# Patient Record
Sex: Male | Born: 1935 | Race: White | Hispanic: No | Marital: Single | Smoking: Never smoker
Health system: Southern US, Community
[De-identification: ages and names within clinical notes are randomized; demographics above are authoritative.]

## PROBLEM LIST (undated history)

## (undated) DIAGNOSIS — E785 Hyperlipidemia, unspecified: Secondary | ICD-10-CM

## (undated) DIAGNOSIS — K210 Gastro-esophageal reflux disease with esophagitis: Secondary | ICD-10-CM

## (undated) DIAGNOSIS — R5381 Other malaise: Secondary | ICD-10-CM

## (undated) DIAGNOSIS — N182 Chronic kidney disease, stage 2 (mild): Secondary | ICD-10-CM

## (undated) DIAGNOSIS — N401 Enlarged prostate with lower urinary tract symptoms: Secondary | ICD-10-CM

## (undated) DIAGNOSIS — R269 Unspecified abnormalities of gait and mobility: Secondary | ICD-10-CM

## (undated) DIAGNOSIS — H259 Unspecified age-related cataract: Secondary | ICD-10-CM

## (undated) DIAGNOSIS — I10 Essential (primary) hypertension: Secondary | ICD-10-CM

## (undated) DIAGNOSIS — R7989 Other specified abnormal findings of blood chemistry: Secondary | ICD-10-CM

## (undated) DIAGNOSIS — I509 Heart failure, unspecified: Secondary | ICD-10-CM

## (undated) DIAGNOSIS — R0602 Shortness of breath: Secondary | ICD-10-CM

## (undated) DIAGNOSIS — E039 Hypothyroidism, unspecified: Secondary | ICD-10-CM

## (undated) DIAGNOSIS — G250 Essential tremor: Secondary | ICD-10-CM

## (undated) DIAGNOSIS — R32 Unspecified urinary incontinence: Secondary | ICD-10-CM

## (undated) DIAGNOSIS — IMO0002 Reserved for concepts with insufficient information to code with codable children: Secondary | ICD-10-CM

## (undated) DIAGNOSIS — I2510 Atherosclerotic heart disease of native coronary artery without angina pectoris: Secondary | ICD-10-CM

## (undated) DIAGNOSIS — H9190 Unspecified hearing loss, unspecified ear: Secondary | ICD-10-CM

## (undated) DIAGNOSIS — K449 Diaphragmatic hernia without obstruction or gangrene: Secondary | ICD-10-CM

## (undated) DIAGNOSIS — Z7901 Long term (current) use of anticoagulants: Secondary | ICD-10-CM

## (undated) DIAGNOSIS — R351 Nocturia: Secondary | ICD-10-CM

## (undated) DIAGNOSIS — M2669 Other specified disorders of temporomandibular joint: Secondary | ICD-10-CM

## (undated) DIAGNOSIS — G252 Other specified forms of tremor: Secondary | ICD-10-CM

## (undated) HISTORY — DX: Shortness of breath: R06.02

## (undated) HISTORY — DX: Atherosclerotic heart disease of native coronary artery without angina pectoris: I25.10

## (undated) HISTORY — DX: Unspecified hearing loss, unspecified ear: H91.90

## (undated) HISTORY — DX: Unspecified abnormalities of gait and mobility: R26.9

## (undated) HISTORY — DX: Unspecified age-related cataract: H25.9

## (undated) HISTORY — DX: Other malaise: R53.81

## (undated) HISTORY — DX: Unspecified urinary incontinence: R32

## (undated) HISTORY — PX: CARDIAC SURGERY: SHX584

## (undated) HISTORY — DX: Long term (current) use of anticoagulants: Z79.01

## (undated) HISTORY — DX: Hypothyroidism, unspecified: E03.9

## (undated) HISTORY — DX: Other specified disorders of temporomandibular joint: M26.69

## (undated) HISTORY — DX: Hyperlipidemia, unspecified: E78.5

## (undated) HISTORY — DX: Diaphragmatic hernia without obstruction or gangrene: K44.9

## (undated) HISTORY — DX: Benign prostatic hyperplasia with lower urinary tract symptoms: N40.1

## (undated) HISTORY — DX: Essential tremor: G25.0

## (undated) HISTORY — DX: Other specified forms of tremor: G25.2

## (undated) HISTORY — DX: Chronic kidney disease, stage 2 (mild): N18.2

## (undated) HISTORY — DX: Other specified abnormal findings of blood chemistry: R79.89

## (undated) HISTORY — DX: Gastro-esophageal reflux disease with esophagitis: K21.0

## (undated) HISTORY — PX: APPENDECTOMY: SHX54

## (undated) HISTORY — DX: Nocturia: R35.1

---
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
## 1988-11-05 HISTORY — PX: INGUINAL HERNIA REPAIR: SUR1180

## 1998-06-11 ENCOUNTER — Emergency Department (HOSPITAL_COMMUNITY): Admission: EM | Admit: 1998-06-11 | Discharge: 1998-06-11 | Payer: Self-pay | Admitting: Emergency Medicine

## 1998-12-28 ENCOUNTER — Ambulatory Visit (HOSPITAL_BASED_OUTPATIENT_CLINIC_OR_DEPARTMENT_OTHER): Admission: RE | Admit: 1998-12-28 | Discharge: 1998-12-28 | Payer: Self-pay | Admitting: Surgery

## 1999-08-15 ENCOUNTER — Encounter: Payer: Self-pay | Admitting: Anesthesiology

## 1999-08-18 ENCOUNTER — Encounter (INDEPENDENT_AMBULATORY_CARE_PROVIDER_SITE_OTHER): Payer: Self-pay | Admitting: Specialist

## 1999-08-18 ENCOUNTER — Inpatient Hospital Stay (HOSPITAL_COMMUNITY): Admission: RE | Admit: 1999-08-18 | Discharge: 1999-08-20 | Payer: Self-pay | Admitting: Urology

## 2003-01-07 DIAGNOSIS — K449 Diaphragmatic hernia without obstruction or gangrene: Secondary | ICD-10-CM

## 2003-01-07 DIAGNOSIS — K2100 Gastro-esophageal reflux disease with esophagitis, without bleeding: Secondary | ICD-10-CM

## 2003-01-07 DIAGNOSIS — I2510 Atherosclerotic heart disease of native coronary artery without angina pectoris: Secondary | ICD-10-CM

## 2003-01-07 HISTORY — DX: Diaphragmatic hernia without obstruction or gangrene: K44.9

## 2003-01-07 HISTORY — DX: Gastro-esophageal reflux disease with esophagitis, without bleeding: K21.00

## 2003-01-07 HISTORY — DX: Atherosclerotic heart disease of native coronary artery without angina pectoris: I25.10

## 2003-07-27 ENCOUNTER — Encounter: Payer: Self-pay | Admitting: Emergency Medicine

## 2003-07-27 ENCOUNTER — Inpatient Hospital Stay (HOSPITAL_COMMUNITY): Admission: EM | Admit: 2003-07-27 | Discharge: 2003-07-29 | Payer: Self-pay | Admitting: *Deleted

## 2003-07-27 ENCOUNTER — Emergency Department (HOSPITAL_COMMUNITY): Admission: EM | Admit: 2003-07-27 | Discharge: 2003-07-27 | Payer: Self-pay | Admitting: Emergency Medicine

## 2003-09-29 ENCOUNTER — Encounter: Admission: RE | Admit: 2003-09-29 | Discharge: 2003-09-29 | Payer: Self-pay | Admitting: Gastroenterology

## 2009-12-27 DIAGNOSIS — E785 Hyperlipidemia, unspecified: Secondary | ICD-10-CM

## 2009-12-27 DIAGNOSIS — G252 Other specified forms of tremor: Secondary | ICD-10-CM | POA: Insufficient documentation

## 2009-12-27 DIAGNOSIS — G250 Essential tremor: Secondary | ICD-10-CM

## 2009-12-27 DIAGNOSIS — H259 Unspecified age-related cataract: Secondary | ICD-10-CM

## 2009-12-27 DIAGNOSIS — I10 Essential (primary) hypertension: Secondary | ICD-10-CM

## 2009-12-27 HISTORY — DX: Essential tremor: G25.0

## 2009-12-27 HISTORY — DX: Hyperlipidemia, unspecified: E78.5

## 2009-12-27 HISTORY — DX: Unspecified age-related cataract: H25.9

## 2009-12-27 HISTORY — DX: Essential (primary) hypertension: I10

## 2009-12-29 ENCOUNTER — Inpatient Hospital Stay (HOSPITAL_COMMUNITY): Admission: EM | Admit: 2009-12-29 | Discharge: 2010-01-04 | Payer: Self-pay | Admitting: Emergency Medicine

## 2009-12-30 ENCOUNTER — Encounter (HOSPITAL_BASED_OUTPATIENT_CLINIC_OR_DEPARTMENT_OTHER): Payer: Self-pay | Admitting: Internal Medicine

## 2010-01-04 DIAGNOSIS — R5381 Other malaise: Secondary | ICD-10-CM

## 2010-01-04 DIAGNOSIS — I509 Heart failure, unspecified: Secondary | ICD-10-CM

## 2010-01-04 DIAGNOSIS — Z7901 Long term (current) use of anticoagulants: Secondary | ICD-10-CM

## 2010-01-04 HISTORY — DX: Heart failure, unspecified: I50.9

## 2010-01-04 HISTORY — DX: Long term (current) use of anticoagulants: Z79.01

## 2010-01-04 HISTORY — DX: Other malaise: R53.81

## 2010-01-06 DIAGNOSIS — H9190 Unspecified hearing loss, unspecified ear: Secondary | ICD-10-CM

## 2010-01-06 HISTORY — DX: Unspecified hearing loss, unspecified ear: H91.90

## 2010-02-20 DIAGNOSIS — R0602 Shortness of breath: Secondary | ICD-10-CM

## 2010-02-20 HISTORY — DX: Shortness of breath: R06.02

## 2010-06-05 DIAGNOSIS — N182 Chronic kidney disease, stage 2 (mild): Secondary | ICD-10-CM

## 2010-06-05 DIAGNOSIS — E039 Hypothyroidism, unspecified: Secondary | ICD-10-CM

## 2010-06-05 HISTORY — DX: Chronic kidney disease, stage 2 (mild): N18.2

## 2010-06-05 HISTORY — DX: Hypothyroidism, unspecified: E03.9

## 2010-12-11 DIAGNOSIS — R32 Unspecified urinary incontinence: Secondary | ICD-10-CM

## 2010-12-11 DIAGNOSIS — R351 Nocturia: Secondary | ICD-10-CM

## 2010-12-11 DIAGNOSIS — R269 Unspecified abnormalities of gait and mobility: Secondary | ICD-10-CM

## 2010-12-11 HISTORY — DX: Unspecified abnormalities of gait and mobility: R26.9

## 2010-12-11 HISTORY — DX: Nocturia: R35.1

## 2010-12-11 HISTORY — DX: Unspecified urinary incontinence: R32

## 2011-01-24 LAB — URINALYSIS, ROUTINE W REFLEX MICROSCOPIC
Bilirubin Urine: NEGATIVE
Glucose, UA: NEGATIVE mg/dL
Ketones, ur: NEGATIVE mg/dL
Leukocytes, UA: NEGATIVE
Nitrite: NEGATIVE
Protein, ur: 100 mg/dL — AB
Specific Gravity, Urine: 1.021 (ref 1.005–1.030)
Urobilinogen, UA: 1 mg/dL (ref 0.0–1.0)
pH: 5 (ref 5.0–8.0)

## 2011-01-24 LAB — CBC
HCT: 30 % — ABNORMAL LOW (ref 39.0–52.0)
HCT: 32.2 % — ABNORMAL LOW (ref 39.0–52.0)
HCT: 33.5 % — ABNORMAL LOW (ref 39.0–52.0)
Hemoglobin: 10.3 g/dL — ABNORMAL LOW (ref 13.0–17.0)
Hemoglobin: 11 g/dL — ABNORMAL LOW (ref 13.0–17.0)
Hemoglobin: 11.4 g/dL — ABNORMAL LOW (ref 13.0–17.0)
Hemoglobin: 12.1 g/dL — ABNORMAL LOW (ref 13.0–17.0)
MCHC: 34.3 g/dL (ref 30.0–36.0)
MCV: 91.2 fL (ref 78.0–100.0)
MCV: 91.6 fL (ref 78.0–100.0)
Platelets: 163 10*3/uL (ref 150–400)
Platelets: 191 10*3/uL (ref 150–400)
Platelets: 213 10*3/uL (ref 150–400)
RBC: 3.27 MIL/uL — ABNORMAL LOW (ref 4.22–5.81)
RBC: 3.7 MIL/uL — ABNORMAL LOW (ref 4.22–5.81)
RBC: 3.84 MIL/uL — ABNORMAL LOW (ref 4.22–5.81)
RDW: 13.6 % (ref 11.5–15.5)
WBC: 6.9 10*3/uL (ref 4.0–10.5)
WBC: 8.3 10*3/uL (ref 4.0–10.5)
WBC: 9.7 10*3/uL (ref 4.0–10.5)

## 2011-01-24 LAB — HEPARIN LEVEL (UNFRACTIONATED)
Heparin Unfractionated: 0.29 IU/mL — ABNORMAL LOW (ref 0.30–0.70)
Heparin Unfractionated: 0.47 IU/mL (ref 0.30–0.70)

## 2011-01-24 LAB — COMPREHENSIVE METABOLIC PANEL
ALT: 27 U/L (ref 0–53)
AST: 25 U/L (ref 0–37)
Albumin: 2.4 g/dL — ABNORMAL LOW (ref 3.5–5.2)
Alkaline Phosphatase: 50 U/L (ref 39–117)
Alkaline Phosphatase: 51 U/L (ref 39–117)
BUN: 51 mg/dL — ABNORMAL HIGH (ref 6–23)
CO2: 26 mEq/L (ref 19–32)
CO2: 30 mEq/L (ref 19–32)
Calcium: 8 mg/dL — ABNORMAL LOW (ref 8.4–10.5)
Chloride: 103 mEq/L (ref 96–112)
Creatinine, Ser: 1.93 mg/dL — ABNORMAL HIGH (ref 0.4–1.5)
GFR calc Af Amer: 46 mL/min — ABNORMAL LOW (ref >60)
GFR calc non Af Amer: 32 mL/min — ABNORMAL LOW (ref >60)
GFR calc non Af Amer: 38 mL/min — ABNORMAL LOW (ref >60)
Glucose, Bld: 114 mg/dL — ABNORMAL HIGH (ref 70–99)
Glucose, Bld: 117 mg/dL — ABNORMAL HIGH (ref 70–99)
Potassium: 3.9 mEq/L (ref 3.5–5.1)
Sodium: 141 mEq/L (ref 135–145)
Total Bilirubin: 0.6 mg/dL (ref 0.3–1.2)

## 2011-01-24 LAB — DIFFERENTIAL
Basophils Absolute: 0 10*3/uL (ref 0.0–0.1)
Basophils Relative: 0 % (ref 0–1)
Basophils Relative: 0 % (ref 0–1)
Basophils Relative: 0 % (ref 0–1)
Eosinophils Absolute: 0.4 10*3/uL (ref 0.0–0.7)
Eosinophils Relative: 0 % (ref 0–5)
Eosinophils Relative: 4 % (ref 0–5)
Lymphocytes Relative: 12 % (ref 12–46)
Lymphs Abs: 0.3 10*3/uL — ABNORMAL LOW (ref 0.7–4.0)
Lymphs Abs: 1.1 10*3/uL (ref 0.7–4.0)
Monocytes Relative: 6 % (ref 3–12)
Monocytes Relative: 9 % (ref 3–12)
Neutro Abs: 6.2 10*3/uL (ref 1.7–7.7)
Neutro Abs: 6.2 10*3/uL (ref 1.7–7.7)
Neutrophils Relative %: 75 % (ref 43–77)
Neutrophils Relative %: 75 % (ref 43–77)
WBC Morphology: INCREASED

## 2011-01-24 LAB — CARDIAC PANEL(CRET KIN+CKTOT+MB+TROPI)
Relative Index: 3.4 — ABNORMAL HIGH (ref 0.0–2.5)
Total CK: 248 U/L — ABNORMAL HIGH (ref 7–232)
Total CK: 278 U/L — ABNORMAL HIGH (ref 7–232)
Troponin I: 0.26 ng/mL — ABNORMAL HIGH (ref 0.00–0.06)
Troponin I: 0.29 ng/mL — ABNORMAL HIGH (ref 0.00–0.06)

## 2011-01-24 LAB — BASIC METABOLIC PANEL
CO2: 26 mEq/L (ref 19–32)
Calcium: 8 mg/dL — ABNORMAL LOW (ref 8.4–10.5)
Creatinine, Ser: 1.9 mg/dL — ABNORMAL HIGH (ref 0.4–1.5)
GFR calc Af Amer: 40 mL/min — ABNORMAL LOW (ref >60)
GFR calc non Af Amer: 31 mL/min — ABNORMAL LOW (ref >60)
GFR calc non Af Amer: 33 mL/min — ABNORMAL LOW (ref >60)
Glucose, Bld: 144 mg/dL — ABNORMAL HIGH (ref 70–99)
Potassium: 4.1 mEq/L (ref 3.5–5.1)
Sodium: 137 mEq/L (ref 135–145)

## 2011-01-24 LAB — CULTURE, BLOOD (ROUTINE X 2)

## 2011-01-24 LAB — POCT I-STAT, CHEM 8
BUN: 33 mg/dL — ABNORMAL HIGH (ref 6–23)
Calcium, Ion: 1.01 mmol/L — ABNORMAL LOW (ref 1.12–1.32)
Chloride: 104 mEq/L (ref 96–112)
Creatinine, Ser: 1.9 mg/dL — ABNORMAL HIGH (ref 0.4–1.5)

## 2011-01-24 LAB — PROTIME-INR: Prothrombin Time: 13.5 seconds (ref 11.6–15.2)

## 2011-01-24 LAB — CK TOTAL AND CKMB (NOT AT ARMC)
Relative Index: 2.5 (ref 0.0–2.5)
Relative Index: 3 — ABNORMAL HIGH (ref 0.0–2.5)
Total CK: 259 U/L — ABNORMAL HIGH (ref 7–232)

## 2011-01-24 LAB — TROPONIN I: Troponin I: 0.6 ng/mL (ref 0.00–0.06)

## 2011-01-24 LAB — URINE MICROSCOPIC-ADD ON

## 2011-01-24 LAB — BRAIN NATRIURETIC PEPTIDE: Pro B Natriuretic peptide (BNP): 421 pg/mL — ABNORMAL HIGH (ref 0.0–100.0)

## 2011-01-28 LAB — COMPREHENSIVE METABOLIC PANEL
ALT: 26 U/L (ref 0–53)
AST: 29 U/L (ref 0–37)
Albumin: 2.5 g/dL — ABNORMAL LOW (ref 3.5–5.2)
BUN: 25 mg/dL — ABNORMAL HIGH (ref 6–23)
CO2: 30 mEq/L (ref 19–32)
Calcium: 8.1 mg/dL — ABNORMAL LOW (ref 8.4–10.5)
Chloride: 105 mEq/L (ref 96–112)
Creatinine, Ser: 1.61 mg/dL — ABNORMAL HIGH (ref 0.4–1.5)
Creatinine, Ser: 1.76 mg/dL — ABNORMAL HIGH (ref 0.4–1.5)
GFR calc Af Amer: 43 mL/min — ABNORMAL LOW (ref >60)
GFR calc non Af Amer: 40 mL/min — ABNORMAL LOW (ref >60)
Glucose, Bld: 117 mg/dL — ABNORMAL HIGH (ref 70–99)
Sodium: 139 mEq/L (ref 135–145)
Sodium: 141 mEq/L (ref 135–145)
Total Bilirubin: 0.7 mg/dL (ref 0.3–1.2)
Total Protein: 5.7 g/dL — ABNORMAL LOW (ref 6.0–8.3)

## 2011-01-28 LAB — CBC
Hemoglobin: 11.7 g/dL — ABNORMAL LOW (ref 13.0–17.0)
MCHC: 33.8 g/dL (ref 30.0–36.0)
MCV: 91.1 fL (ref 78.0–100.0)
MCV: 91.4 fL (ref 78.0–100.0)
Platelets: 313 10*3/uL (ref 150–400)
RBC: 3.8 MIL/uL — ABNORMAL LOW (ref 4.22–5.81)
RBC: 3.92 MIL/uL — ABNORMAL LOW (ref 4.22–5.81)
RDW: 14.2 % (ref 11.5–15.5)
WBC: 12.6 10*3/uL — ABNORMAL HIGH (ref 4.0–10.5)

## 2011-01-28 LAB — DIFFERENTIAL
Basophils Absolute: 0 10*3/uL (ref 0.0–0.1)
Eosinophils Absolute: 0.3 10*3/uL (ref 0.0–0.7)
Eosinophils Absolute: 0.4 10*3/uL (ref 0.0–0.7)
Eosinophils Relative: 3 % (ref 0–5)
Lymphocytes Relative: 11 % — ABNORMAL LOW (ref 12–46)
Lymphocytes Relative: 12 % (ref 12–46)
Lymphs Abs: 1.2 10*3/uL (ref 0.7–4.0)
Lymphs Abs: 1.4 10*3/uL (ref 0.7–4.0)
Monocytes Absolute: 1.1 10*3/uL — ABNORMAL HIGH (ref 0.1–1.0)
Monocytes Relative: 9 % (ref 3–12)
Neutro Abs: 8.3 10*3/uL — ABNORMAL HIGH (ref 1.7–7.7)
Neutrophils Relative %: 77 % (ref 43–77)

## 2011-01-28 LAB — BRAIN NATRIURETIC PEPTIDE: Pro B Natriuretic peptide (BNP): 167 pg/mL — ABNORMAL HIGH (ref 0.0–100.0)

## 2011-01-28 LAB — PROTIME-INR
INR: 1.51 — ABNORMAL HIGH (ref 0.00–1.49)
Prothrombin Time: 18.1 seconds — ABNORMAL HIGH (ref 11.6–15.2)

## 2011-01-28 LAB — HEPARIN LEVEL (UNFRACTIONATED): Heparin Unfractionated: 0.57 IU/mL (ref 0.30–0.70)

## 2011-03-19 DIAGNOSIS — N138 Other obstructive and reflux uropathy: Secondary | ICD-10-CM

## 2011-03-19 HISTORY — DX: Other obstructive and reflux uropathy: N13.8

## 2011-03-23 NOTE — Cardiovascular Report (Signed)
NAME:  Larry Underwood, Larry Underwood NO.:  [id number]   MEDICAL RECORD NO.:  [id number]                   PATIENT TYPE:  INP   LOCATION:  6529                                 FACILITY:  [facility]   PHYSICIAN:  Meade Maw, M.D.                 DATE OF BIRTH:  1909-02-23   DATE OF PROCEDURE:  DATE OF DISCHARGE:                              CARDIAC CATHETERIZATION   REFERRING PHYSICIAN:  Vania Rea. Jarold Underwood, M.D. [facility].   INDICATIONS FOR PROCEDURE:  Chest pain with positive cardiac enzymes.   PROCEDURE:  After obtaining written informed consent the patient was brought  to the cardiac catheterization laboratory in the postabsorptive state.  Preoperative sedation was achieved using Versed 1 mg IV.  The right groin  was prepped and draped in the usual sterile fashion.  Local anesthesia was  achieved using 1% Xylocaine.  A 6-French hemostasis sheath was placed into  the right femoral artery using the modified Seldinger technique.  Selective  coronary angiography was performed using a JL4, JR4 Judkins catheter.  Single plane ventriculogram was performed in the RAO positions.  Multiple  views were obtained.  All catheter exchanges were made over a guidewire.  The hemostasis sheath was placed following each engagement.  The patient was  noted to be somewhat bradycardic throughout the procedure with heart rates  in the 50-60s.  Following Versed 1 mg IV he had systolic pressure decreased  from 106 down into the 80s.  He was given IV fluids with improvement in his  pressure.   FINDINGS:  The aortic pressure was 106/60.  LV pressure is 106/8.  EDP was  11.  Single plane ventriculogram revealed inferior basilar hypokinesis,  ejection fraction of 60%.  Fluoroscopy revealed significant calcification  around the mitral valve anulus.   CORONARY ANGIOGRAPHY:  The left main coronary artery bifurcates into the  left anterior descending and circumflex vessel.  There is no significant  disease in the left main coronary artery.   Left anterior descending gives rise to a moderate sized D1, small D2, small  D3, goes on to end as an apical branch.  There is luminal irregularities in  the left anterior descending vessel.  The first diagonal has a 60-70% ostial  lesion.   Circumflex vessel is a moderate sized vessel.  Gives rise to a trivial OM 1,  small to moderate OM 2, small OM 3.  There is sequential 60-70% lesion in  the proximal obtuse marginal.   Right coronary artery is a large, dominant artery.  There is a 90% lesion  noted in the proximal right coronary artery.   FINAL IMPRESSION:  1. Critical disease in the proximal right coronary artery.  2. Preserved left ventricular function.  3. Calcification in the mitral anulus.    RECOMMENDATIONS:  The films were reviewed with Larry Underwood, M.D.  He  agrees to proceed with intervention on the proximal  right coronary artery.  The patient will have a transthoracic echocardiogram performed in the  outpatient setting for further evaluation of mitral regurgitation.                                               Meade Maw, M.D.    HP/MEDQ  D:  07/28/2003  T:  07/29/2003  Job:  [id number]

## 2011-03-23 NOTE — H&P (Signed)
NAME:  [name], [name] NO.:  [id number]   MEDICAL RECORD NO.:  [id number]                   PATIENT TYPE:  INP   LOCATION:  3730                                 FACILITY:  [facility]   PHYSICIAN:  Meade Maw, M.D.                 DATE OF BIRTH:  [birth year]   DATE OF ADMISSION:  07/27/2003  DATE OF DISCHARGE:                                HISTORY & PHYSICAL   HISTORY OF PRESENT ILLNESS:  Larry Underwood is a 77 year old white man who  is admitted to [facility] for further evaluation of chest pain.   The patient, who has no past history of cardiac disease, presented to the  emergency department at [facility] after experiencing an episode  of chest pain.  This began while he was driving home from work.  The chest  pain was described as a pressure in the left anterior chest.  It did not  radiate.  It was associated with mild dyspnea but no diaphoresis or nausea.  There were no exacerbating or ameliorating factors.  It appeared not to be  related to position, activity, meals, or respirations.  It continued until  he arrived home.  Within approximately 30 minutes, it had resolved.  However, he then proceeded to the emergency department at [facility] and admission was recommended for further evaluation.  The patient  has not experienced any chest pain since the initial episode.   As noted, the patient has no past history of cardiac disease, including no  history of chest pain, myocardial infarction, coronary artery disease, or  congestive heart failure.  He has no risk factors for coronary artery  disease, including no history of hypertension, hyperlipidemia, diabetes  mellitus, smoking, or family history of early coronary artery disease.   The patient has no other medical problems.   MEDICATIONS:  He takes no medications.   ALLERGIES:  He is not allergic to any medications.   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  He lives with  his second wife.  He has children from his first marriage.   PREVIOUS OPERATIONS:  1. The patient has undergone herniorrhaphies, in [place] and 1996.  2. He has also undergone bilateral cataract operations.   SIGNIFICANT INJURIES:  None.   FAMILY HISTORY:  Family history is unremarkable.  Specifically, there is no  history of hypertension, coronary artery disease, stroke, diabetes, or  cancer.  His mother lived until she was 68.   REVIEW OF SYSTEMS:  Review of systems reveals no problems related to his  head, eyes, ears, nose, mouth, throat, lungs, gastrointestinal system,  genitourinary system, or extremities.  There is no history of a neurologic  or psychiatric disorder.  There is no history of fever, chills, or weight  loss.   PHYSICAL EXAMINATION:  VITAL SIGNS:  Blood pressure 156/88.  Pulse 82 and  regular.  Respirations 20.  Temperature 97.8.  GENERAL:  The patient was an elderly white male in no discomfort.  He was  somewhat hard of hearing.  He appeared younger than his stated age.  He was  alert, oriented, and appropriate.  HEENT:  Head, eyes, nose, and mouth were normal.  NECK:  The neck was without thyromegaly or adenopathy.  Carotid pulses were  palpable bilaterally and without bruits.  CARDIAC:  Examination revealed a normal S1 and S2.  There was no S3, S4,  rub, or click.  There was a soft systolic ejection murmur heard at the left  sternal border.  Cardiac rhythm was regular.  No chest wall tenderness was  noted.  LUNGS:  The lungs were clear.  ABDOMEN:  The abdomen was soft and nontender.  There was no mass,  hepatosplenomegaly, bruit, distention, rebound, guarding or rigidity.  Bowel  sounds were normal.  RECTAL AND GENITAL:  Examinations were not performed as they were not  pertinent to the reason for acute care hospitalization.  EXTREMITIES:  The extremities were without edema, deviation, or deformity.  Radial and dorsalis pedal  pulses were palpable bilaterally.  NEUROLOGIC:  Brief screening neurologic survey was unremarkable.   LABORATORY AND ACCESSORY CLINICAL DATA:  The electrocardiogram revealed  marked sinus bradycardia with frequent PACs.  The possibility of an ectopic  atrial bradycardia could not be excluded.  There was diffuse T wave  flattening.   Potassium was 4.1, BUN 26, and creatinine 1.4.  White count was 6.2 with a  hemoglobin of 12.5 and hematocrit of 36.  Initial CK was 134 with a CK-MB of  8.9, relative index of 6.6, and troponin of 0.49.  The remaining studies  were pending at the time of this dictation.   IMPRESSION:  Chest pain, rule out myocardial infarction.  The history and  troponin suggest that the patient's chest pain is likely cardiac in origin.  He may either have unstable angina or a small myocardial infarction.   PLAN:  1. Telemetry.  2. Serial cardiac enzymes.  3. Aspirin.  4. Heparin.  5. Nitrol paste.  6. Fasting lipid profile.  7. No beta blocker at this time if his heart rate is in the 60s.  8. Further evaluation per Dr. Meade Maw.      Quita Skye. Waldon Reining, MD                   Meade Maw, M.D.   MSC/MEDQ  D:  07/28/2003  T:  07/28/2003  Job:  [id number]   cc:   Quita Skye. Waldon Reining, MD  [address]  [place], [place] [address]  Fax: [phone]

## 2011-03-23 NOTE — Discharge Summary (Signed)
NAME:  Larry Underwood, Larry Underwood NO.:  [id number]   MEDICAL RECORD NO.:  [id number]                   PATIENT TYPE:  INP   LOCATION:  6529                                 FACILITY:  [facility]   PHYSICIAN:  Meade Maw, M.D.                 DATE OF BIRTH:  11-09-1908   DATE OF ADMISSION:  07/27/2003  DATE OF DISCHARGE:  07/29/2003                                 DISCHARGE SUMMARY   DISCHARGING PHYSICIAN:  Dr. Fraser Din.   CHIEF COMPLAINT/REASON FOR ADMISSION:  This is a 77 year old male patient  who presented, to the [facility] Emergency Department, with complaints of  acute onset of chest pain, after coming from his office.  The chest pain did  not relieve with rest and it was in the middle of his chest, and quite  intense, and it caused shortness of breath.  Due to his symptoms and EKG  changes and age, it was felt that he was at high risk for MI and possible  need for cardiac cath; therefore, he was transferred over to [facility] at which point he was admitted to a telemetry unit for further  observation.   The patient has no significant past medical history.   ADMISSION DIAGNOSIS:  Chest pain rule out myocardial infarction.   HOSPITAL COURSE:  Problem #1.  Chest pain.  The patient again was admitted  to a telemetry unit where serial cardiac isoenzymes were done.  His peak  troponin was 1.10 with a normal creatinine.  He underwent cardiac  catheterization, on July 28, 2003, that revealed an RCA lesion of 99%  with thrombus.  He underwent PTCA of this area and is status post stent with  post PTCA stenosis being 0%.  He was placed on aspirin and Plavix post cath  and as well as Integrelin for 18 hours.  Other findings on the  catheterization were as follows:  the LAD had a moderate diagonal 1 and a  small diagonal 2 lesion.  There was a 70% ostium lesion at diagonal 1.  The  circumflex had trace obtuse marginal 1 lesion and an obtuse marginal  moderate located proximal 60-70% lesion.  His EF was 60% on the  catheterization.  The patient has done well in the post cath setting.  His  vital signs have remained stable and he has had no chest pain.  He did have  some documented hematemesis per nursing staff, although the patient  adamantly denied any hematemesis or hematochezia symptoms while hospitalized  or prior to arrival.  The patient will be discharged out on Plavix and  aspirin and is to followup with Dr. Fraser Din in her office in 2-3 weeks.   Problem #2.  Hematuria.  Within the first 12 hours after the patient  underwent cath and was on Integrelin, he developed hematuria and some  difficulty in controlling his urine as well.  The  difficulty controlling  urine was a problem before coming to the hospital per the patient's report.  His Integrelin was stopped by the nursing staff during the night, due to the  significant hematuria.  Dr. Etta Grandchild, of urology, did evaluate the patient  while here.  He has placed the patient on antibiotics and will complete an  additional GU or urology workup in the outpatient setting.   FINAL DISCHARGE DIAGNOSES:  1. 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  2. Trace coronary artery disease and other arteries as listed previously.  3. Hematuria to be followed outpatient by urology.  4. Questionable hematemesis.  The patient is scheduled for a     gastrointestinal workup as an outpatient.   DISCHARGE MEDICATIONS:  1. Protonix 40 mg p.o. daily.  2. Plavix 75 mg daily.  3. Aspirin 81 mg daily.  4. Sulfa/TNP double strength one b.i.d. for seven days.   DISCHARGE INSTRUCTIONS:   ACTIVITY:  No driving or sexual activity today, on the date of discharge.  Otherwise no lifting greater than 5 pounds, no bending, stooping or  straining for the next two days.   DIET:  Low-fat, low-cholesterol.   WOUND CARE:  Shower only x 2  days.   ADDITIONAL INSTRUCTIONS:  He is to call Dr. Lindell Spar office at [phone] if  he experiences any pain, swelling or bruising at the cath site.   FOLLOWUP APPOINTMENTS:  1. He has an appointment scheduled with Dr. Fraser Din on Tuesday, August 24, 2003 at 2:15 p.m.  2. And, he has an appointment with Dr. Georgian Co with [facility] on     August 19, 2003, at 3 p.m.  That telephone number is [phone].  And,     they have requested that our office fax pertinent records prior to this     patient's appointment.      Allison L. Joretta Bachelor, M.D.    ALE/MEDQ  D:  07/29/2003  T:  07/30/2003  Job:  [id number]

## 2011-03-23 NOTE — Discharge Summary (Signed)
   NAME:  Larry Underwood, Larry Underwood                        ACCOUNT NO.:  [id number]   MEDICAL RECORD NO.:  [id number]                   PATIENT TYPE:  INP   LOCATION:  6529                                 FACILITY:  [facility]   PHYSICIAN:  Meade Maw, M.D.                 DATE OF BIRTH:  [birth year]   DATE OF ADMISSION:  07/27/2003  DATE OF DISCHARGE:  07/29/2003                                 DISCHARGE SUMMARY   ADDENDUM:  This is an addendum to discharge summary [phone].   Additional recommendations from urology:  Patient at time of dictation was  awaiting a bladder scan, ordered by Dr. Etta Grandchild, there is the potential that  the patient will go home with an indwelling Foley catheter to a leg bag.      Allison L. Joretta Bachelor, M.D.    ALE/MEDQ  D:  07/29/2003  T:  07/31/2003  Job:  [id number]

## 2011-03-23 NOTE — Consult Note (Signed)
   NAME:  Larry Underwood, MOLCHAN                        ACCOUNT NO.:  [id number]   MEDICAL RECORD NO.:  [id number]                   PATIENT TYPE:  INP   LOCATION:  6529                                 FACILITY:  [facility]   PHYSICIAN:  Claudette Laws, M.D.               DATE OF BIRTH:  Mar 28, 1909   DATE OF CONSULTATION:  07/29/2003  DATE OF DISCHARGE:  07/29/2003                                   CONSULTATION   DISCUSSION:  I saw this gentleman earlier in the day with dribbling  urination, hematuria.  He was found to have about a 500 cc post void  residual by ultrasound.  I then came in late in the afternoon and put in a  20 [place] coude tip Foley catheter and we irrigated out a bladder full of  clots.  I spent about 30 minutes after which the irrigant was clear.  There  appeared to be no more clots.  However, the patient will have to be on  Plavix and aspirin because of his recent angioplasty.  The catheter was  hooked to a urinary leg bag and he will call me tonight if he has any  problems.  Otherwise the family will pick up his sulfa trimethoprim tonight,  start him on the antibiotics and then let me know how he gets along.                                                Claudette Laws, M.D.    RFS/MEDQ  D:  07/29/2003  T:  07/31/2003  Job:  [id number]

## 2011-03-23 NOTE — Consult Note (Signed)
NAME:  Larry Underwood, Larry Underwood                        ACCOUNT NO.:  [id number]   MEDICAL RECORD NO.:  [id number]                   PATIENT TYPE:  INP   LOCATION:  6529                                 FACILITY:  [facility]   PHYSICIAN:  Claudette Laws, M.D.               DATE OF BIRTH:  [birth year]   DATE OF CONSULTATION:  07/29/2003  DATE OF DISCHARGE:  07/29/2003                                   CONSULTATION   CONSULTING PHYSICIAN:  Claudette Laws, M.D.   REFERRING PHYSICIAN:  Meade Maw, M.D.   CHIEF COMPLAINT:  Bloody urine.   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  The patient has had two TUR's in the past,  apparently the last one being five to six years ago by my partner, Dr. Bjorn Pippin.  The patient has had no fever or chills.  The patient was scheduled  to go home today after his urologic evaluation.  He feels comfortable, no  back pain.  The patient has been on heparin, but now is on aspirin 81 mg a  day, as well as Plavix.  The patient is married and plays golf every Friday  apparently.   LABORATORY DATA:  His hemoglobin today was 11.7, hematocrit was 35.3, his  white cell count was 14,900.  His platelet count was 265,000.  His  electrolytes were normal today with a BUN of 17 and a creatinine of 1.2.   DISCHARGE MEDICATIONS:  1. Protonix 40 mg a day.  2. Plavix 75 mg a day.  3. Aspirin 81 mg a day.   PHYSICAL EXAMINATION:  GENERAL:  He appears younger then his stated age.  No  acute distress.  VITAL SIGNS:  BP is 110/60, pulse 60, respirations are normal.  ABDOMEN:  Slightly obese, but no obvious masses, no obvious hernias.  Some  ecchymoses along the right groin area.  GENITALIA:  Normal circumcised male, normal penis and meatus.  No lesions.  Scrotum is  normal in appearance.  Testicles are bilaterally symmetrical, no  obvious masses.  RECTAL:  A markedly enlarged prostate gland increased in the lateral sulci  consistent with BPH, no hard nodules, nontender, no other anal or rectal  lesions, good sphincter tone.   IMPRESSION:  1. Gross hematuria, rule out urinary tract infection.  2. History of benign prostatic hypertrophy, status post transurethral     resection of prostate x2.  3. Rule out acute urinary retention.  4. Status post angioplasty for coronary artery disease on July 27, 2003.   DISCUSSION:  I went over the findings with the patient and I told him that  he could go home today, but first I would like  to collect the urine for a  culture and also check a post-void residual by ultrasound.  A prescription  for sulfa trimethoprim DS one b.i.d. #14 was written pending the urine  culture.  Otherwise, I told him that we would need to follow up in the  office in the next week or two, perform cystoscopic evaluation for the  hematuria which may be prostatic in origin, considering the size of his  gland, his past history, and his anticoagulation history.  I told him it was  okay to go on home and if he has any trouble in the interim we will let him  know.  However, if it appears that he is in retention by post-void residual,  then I would recommend a Foley catheter for a few days, follow up in the  office in the first of the week.  He seems comfortable with that  recommendation.                                               Claudette Laws, M.D.    RFS/MEDQ  D:  07/29/2003  T:  07/30/2003  Job:  [id number]

## 2011-10-07 ENCOUNTER — Emergency Department (HOSPITAL_COMMUNITY)
Admission: EM | Admit: 2011-10-07 | Discharge: 2011-10-07 | Disposition: A | Discharge status: Home / Self Care | Payer: Medicare Other | Attending: Emergency Medicine | Admitting: Emergency Medicine

## 2011-10-07 ENCOUNTER — Emergency Department (HOSPITAL_COMMUNITY): Payer: Medicare Other

## 2011-10-07 ENCOUNTER — Encounter: Payer: Self-pay | Admitting: Emergency Medicine

## 2011-10-07 DIAGNOSIS — R059 Cough, unspecified: Secondary | ICD-10-CM | POA: Insufficient documentation

## 2011-10-07 DIAGNOSIS — R296 Repeated falls: Secondary | ICD-10-CM | POA: Insufficient documentation

## 2011-10-07 DIAGNOSIS — W19XXXA Unspecified fall, initial encounter: Secondary | ICD-10-CM

## 2011-10-07 DIAGNOSIS — T1490XA Injury, unspecified, initial encounter: Secondary | ICD-10-CM | POA: Insufficient documentation

## 2011-10-07 DIAGNOSIS — I10 Essential (primary) hypertension: Secondary | ICD-10-CM | POA: Insufficient documentation

## 2011-10-07 DIAGNOSIS — R0602 Shortness of breath: Secondary | ICD-10-CM | POA: Insufficient documentation

## 2011-10-07 DIAGNOSIS — I509 Heart failure, unspecified: Secondary | ICD-10-CM | POA: Insufficient documentation

## 2011-10-07 DIAGNOSIS — Y92009 Unspecified place in unspecified non-institutional (private) residence as the place of occurrence of the external cause: Secondary | ICD-10-CM | POA: Insufficient documentation

## 2011-10-07 DIAGNOSIS — J069 Acute upper respiratory infection, unspecified: Secondary | ICD-10-CM | POA: Insufficient documentation

## 2011-10-07 DIAGNOSIS — R05 Cough: Secondary | ICD-10-CM | POA: Insufficient documentation

## 2011-10-07 HISTORY — DX: Essential (primary) hypertension: I10

## 2011-10-07 HISTORY — DX: Reserved for concepts with insufficient information to code with codable children: IMO0002

## 2011-10-07 HISTORY — DX: Heart failure, unspecified: I50.9

## 2011-10-07 LAB — URINALYSIS, ROUTINE W REFLEX MICROSCOPIC
Bilirubin Urine: NEGATIVE
Glucose, UA: NEGATIVE mg/dL
Leukocytes, UA: NEGATIVE
Nitrite: NEGATIVE
Specific Gravity, Urine: 1.017 (ref 1.005–1.030)
pH: 6 (ref 5.0–8.0)

## 2011-10-07 LAB — URINE MICROSCOPIC-ADD ON

## 2011-10-07 LAB — POCT I-STAT, CHEM 8
BUN: 22 mg/dL (ref 6–23)
Calcium, Ion: 1.04 mmol/L — ABNORMAL LOW (ref 1.12–1.32)
Chloride: 100 mEq/L (ref 96–112)
Glucose, Bld: 123 mg/dL — ABNORMAL HIGH (ref 70–99)
HCT: 40 % (ref 39.0–52.0)
Potassium: 4.1 mEq/L (ref 3.5–5.1)

## 2011-10-07 MED ORDER — HYDROMORPHONE HCL PF 2 MG/ML IJ SOLN
INTRAMUSCULAR | Status: AC
  Filled 2011-10-07: qty 1

## 2011-10-07 NOTE — ED Provider Notes (Signed)
Medical screening examination/treatment/procedure(s) were conducted as a shared visit with non-physician practitioner(s) and myself.  I personally evaluated the patient during the encounter  42 y male says he "slumped" to the floor last night.  No severe trauma.  He denies pain anywhere.  He denies any sxs. He wants to go home.    He has not been sick recently.   Has obvious congested breath sounds audible even without stethoscope.   O/w pe is unremarkable.  Will check cxr and blood and urine to search for etio for weakness.   Nicholes Stairs, MD 10/07/11 1350

## 2011-10-07 NOTE — ED Provider Notes (Signed)
Medical screening examination/treatment/procedure(s) were conducted as a shared visit with non-physician practitioner(s) and myself.  I personally evaluated the patient during the encounter  Nicholes Stairs, MD 10/07/11 [phone]

## 2011-10-07 NOTE — ED Notes (Signed)
Pt transported home per family

## 2011-10-07 NOTE — ED Notes (Signed)
[facility] after fall while getting out of bed, no LOC, pt had congestion and fever and cough x 3 days. Want eval for pneumonia. No injuries from fall.

## 2011-10-07 NOTE — ED Notes (Signed)
Splint and sling applied per ortho tech

## 2011-10-07 NOTE — ED Notes (Signed)
VHQ:IO96<EX> Expected date:10/07/11<BR> Expected time: 9:52 AM<BR> Means of arrival:<BR> Comments:<BR> EMS Fall from [facility]

## 2011-10-07 NOTE — ED Provider Notes (Signed)
History     CSN: [id number] Arrival date & time: 10/07/2011 10:08 AM   First MD Initiated Contact with Patient 10/07/11 1146      Chief Complaint  Patient presents with  . Fall    (Consider location/radiation/quality/duration/timing/severity/associated sxs/prior treatment) HPI History provided by pt.   Pt was standing in his bedroom this morning when he collapsed to the floor.  Denies syncope.  Believes his legs became weak and gave out on him.  No preceding lightheadedness, dizziness, CP, SOB, palpitations.   Has recently had cough and congestion but no known fever and denies vomiting, diarrhea, urinary sx.  No pain anywhere, before or since fall.  Did not hit head.  Family would like pt to be evaluated for pneumonia.  Past Medical History  Diagnosis Date  . CHF (congestive heart failure)   . Atrial fib/flutter, transient   . Hypertension     Past Surgical History  Procedure Date  . Cardiac surgery     History reviewed. No pertinent family history.  History  Substance Use Topics  . Smoking status: Not on file  . Smokeless tobacco: Not on file  . Alcohol Use: No      Review of Systems  All other systems reviewed and are negative.    Allergies  Review of patient's allergies indicates no known allergies.  Home Medications   Current Outpatient Rx  Name Route Sig Dispense Refill  . ACETAMINOPHEN 500 MG PO TABS Oral Take 500 mg by mouth every 6 (six) hours as needed. For headache or pain.     Marland Kitchen CALCIUM CARBONATE 1250 MG PO TABS Oral Take 1 tablet by mouth daily.      Marland Kitchen DABIGATRAN ETEXILATE MESYLATE 75 MG PO CAPS Oral Take by mouth every 12 (twelve) hours.      Marland Kitchen DILTIAZEM HCL ER COATED BEADS 120 MG PO CP24 Oral Take 120 mg by mouth daily.      Marland Kitchen DILTIAZEM HCL 120 MG PO TABS Oral Take 120 mg by mouth 4 (four) times daily.      Marland Kitchen FINASTERIDE 5 MG PO TABS Oral Take 5 mg by mouth daily.      Marland Kitchen LEVOTHYROXINE SODIUM 50 MCG PO TABS Oral Take 50 mcg by mouth daily.        Marland Kitchen PRESERVISION AREDS PO Oral Take 1 tablet by mouth 2 (two) times daily.      Marland Kitchen SIMVASTATIN 40 MG PO TABS Oral Take 40 mg by mouth at bedtime.        BP 119/64  Pulse 93  Temp(Src) 98.6 F (37 C) (Oral)  Resp 21  SpO2 100%  Physical Exam  Nursing note and vitals reviewed. Constitutional: He is oriented to person, place, and time. He appears well-developed and well-nourished. No distress.  HENT:  Head: Normocephalic and atraumatic.  Eyes:       Normal appearance  Neck: Normal range of motion.  Cardiovascular: Normal rate and regular rhythm.   Pulmonary/Chest: Effort normal. No respiratory distress.       Diffuse expiratory rhonchi  Abdominal: Soft. Bowel sounds are normal. He exhibits no distension. There is no tenderness.  Musculoskeletal: He exhibits no edema and no tenderness.       Upper and lower extremities w/ full, non-painful ROM.  2+ radial and DP pulses.  Entire spine non-tender.  Pelvis stable.    Neurological: He is alert and oriented to person, place, and time.  Skin: Skin is warm and dry. No rash noted.  No ecchymosis/abrasions  Psychiatric: He has a normal mood and affect. His behavior is normal.    ED Course  Procedures (including critical care time)   Labs Reviewed  I-STAT, CHEM 8  URINALYSIS, ROUTINE W REFLEX MICROSCOPIC   No results found.   No diagnosis found.    MDM  Pt collapsed to the floor while standing in his bedroom today and believes that his legs were weak and gave out on him.  Exam sig for diffuse expiratory rhonchi bilateral lungs.  O2 sat 98-100% rm air and VS w/in nml limits. No signs of trauma.  I-stat unremarkable and CXR/urinalysis neg for infection.  Urine sent for culture for mild bacteria.  All results discussed w/ pt and his family.  Recommended he use his cane and f/u with PCP.  Return precautions discussed.         Arie Sabina Yarnell, [place] 10/07/11 1423

## 2011-10-10 DIAGNOSIS — I4891 Unspecified atrial fibrillation: Secondary | ICD-10-CM

## 2011-10-10 DIAGNOSIS — IMO0002 Reserved for concepts with insufficient information to code with codable children: Secondary | ICD-10-CM

## 2011-10-10 HISTORY — DX: Unspecified atrial fibrillation: I48.91

## 2011-10-10 HISTORY — DX: Reserved for concepts with insufficient information to code with codable children: IMO0002

## 2011-10-15 DIAGNOSIS — M2669 Other specified disorders of temporomandibular joint: Secondary | ICD-10-CM

## 2011-10-15 HISTORY — DX: Other specified disorders of temporomandibular joint: M26.69

## 2012-02-07 ENCOUNTER — Encounter: Payer: Self-pay | Admitting: *Deleted

## 2012-02-26 ENCOUNTER — Other Ambulatory Visit (HOSPITAL_COMMUNITY): Payer: Self-pay | Admitting: Internal Medicine

## 2012-02-26 DIAGNOSIS — R0602 Shortness of breath: Secondary | ICD-10-CM

## 2012-03-05 ENCOUNTER — Ambulatory Visit (HOSPITAL_COMMUNITY)
Admission: RE | Admit: 2012-03-05 | Discharge: 2012-03-05 | Disposition: A | Discharge status: Home / Self Care | Payer: Medicare Other | Source: Ambulatory Visit | Attending: Internal Medicine | Admitting: Internal Medicine

## 2012-03-05 DIAGNOSIS — R0602 Shortness of breath: Secondary | ICD-10-CM | POA: Insufficient documentation

## 2012-03-05 LAB — BLOOD GAS, ARTERIAL
Acid-base deficit: 2.7 mmol/L — ABNORMAL HIGH (ref 0.0–2.0)
TCO2: 22.4 mmol/L (ref 0–100)
pCO2 arterial: 35.2 mmHg (ref 35.0–45.0)
pO2, Arterial: 79.7 mmHg — ABNORMAL LOW (ref 80.0–100.0)

## 2012-03-05 MED ORDER — ALBUTEROL SULFATE (5 MG/ML) 0.5% IN NEBU
2.5000 mg | INHALATION_SOLUTION | Freq: Once | RESPIRATORY_TRACT | Status: AC
  Administered 2012-03-05: 2.5 mg via RESPIRATORY_TRACT

## 2012-03-07 ENCOUNTER — Other Ambulatory Visit: Payer: Self-pay | Admitting: Internal Medicine

## 2012-03-07 ENCOUNTER — Ambulatory Visit (HOSPITAL_COMMUNITY)
Admission: RE | Admit: 2012-03-07 | Discharge: 2012-03-07 | Disposition: A | Discharge status: Home / Self Care | Payer: Medicare Other | Source: Ambulatory Visit | Attending: Internal Medicine | Admitting: Internal Medicine

## 2012-03-07 DIAGNOSIS — R0602 Shortness of breath: Secondary | ICD-10-CM | POA: Insufficient documentation

## 2012-03-24 DIAGNOSIS — R739 Hyperglycemia, unspecified: Secondary | ICD-10-CM | POA: Insufficient documentation

## 2012-03-24 DIAGNOSIS — R7989 Other specified abnormal findings of blood chemistry: Secondary | ICD-10-CM

## 2012-03-24 HISTORY — DX: Other specified abnormal findings of blood chemistry: R79.89

## 2013-01-23 ENCOUNTER — Other Ambulatory Visit: Payer: Self-pay | Admitting: *Deleted

## 2013-01-23 MED ORDER — DABIGATRAN ETEXILATE MESYLATE 75 MG PO CAPS: ORAL_CAPSULE | ORAL | Status: DC

## 2013-02-19 ENCOUNTER — Other Ambulatory Visit (HOSPITAL_COMMUNITY): Payer: Self-pay | Admitting: Internal Medicine

## 2013-02-25 ENCOUNTER — Other Ambulatory Visit (HOSPITAL_COMMUNITY): Payer: Self-pay | Admitting: Internal Medicine

## 2013-02-26 ENCOUNTER — Other Ambulatory Visit: Payer: Self-pay | Admitting: *Deleted

## 2013-02-26 ENCOUNTER — Other Ambulatory Visit (HOSPITAL_COMMUNITY): Payer: Self-pay | Admitting: Internal Medicine

## 2013-03-06 ENCOUNTER — Encounter: Payer: Self-pay | Admitting: Internal Medicine

## 2013-03-26 ENCOUNTER — Other Ambulatory Visit: Payer: Self-pay | Admitting: *Deleted

## 2013-03-26 MED ORDER — FINASTERIDE 5 MG PO TABS: ORAL_TABLET | ORAL | Status: DC

## 2013-04-29 ENCOUNTER — Encounter: Payer: Self-pay | Admitting: *Deleted

## 2013-05-04 ENCOUNTER — Encounter: Payer: Self-pay | Admitting: Internal Medicine

## 2013-05-04 ENCOUNTER — Non-Acute Institutional Stay: Payer: Medicare Other | Admitting: Internal Medicine

## 2013-05-04 VITALS — BP 100/62 | HR 60 | Temp 97.6°F | Ht 64.0 in | Wt 140.0 lb

## 2013-05-04 DIAGNOSIS — R7989 Other specified abnormal findings of blood chemistry: Secondary | ICD-10-CM

## 2013-05-04 DIAGNOSIS — E039 Hypothyroidism, unspecified: Secondary | ICD-10-CM

## 2013-05-04 DIAGNOSIS — I5032 Chronic diastolic (congestive) heart failure: Secondary | ICD-10-CM

## 2013-05-04 DIAGNOSIS — R269 Unspecified abnormalities of gait and mobility: Secondary | ICD-10-CM

## 2013-05-04 DIAGNOSIS — I10 Essential (primary) hypertension: Secondary | ICD-10-CM

## 2013-05-04 DIAGNOSIS — E785 Hyperlipidemia, unspecified: Secondary | ICD-10-CM

## 2013-05-04 DIAGNOSIS — G250 Essential tremor: Secondary | ICD-10-CM

## 2013-05-04 DIAGNOSIS — N182 Chronic kidney disease, stage 2 (mild): Secondary | ICD-10-CM

## 2013-05-11 MED ORDER — LEVOTHYROXINE SODIUM 75 MCG PO TABS: ORAL_TABLET | ORAL | Status: AC

## 2013-05-11 NOTE — Progress Notes (Signed)
Date: 05/11/2013  MRN:  [id number] Name:  Larry Underwood Sex:  male Age:  79 y.o. DOB:[birth year]   [facility] #: [phone]                      Facility/Room; [facility] Level Of Care: Independent Provider: Murray Hodgkins, M.D.  Emergency Contacts: Contact Information   Name Relation Home Work Mobile   Petrich,Ruth Spouse [phone]     Toran, Murch [phone]  [phone]      Code Status: Full code  Allergies:No Known Allergies   Chief Complaint  Patient presents with  . Medical Managment of Chronic Issues    Comprehensive Exam : thyroid, blood pressure, hyperglycemia, CHF     HPI: CHF (congestive heart failure), chronic, diastolic: Well controlled. Initially diagnosed 2011 at time of onset of atrial fibrillation.  Hypertension: Controlled  Other abnormal blood chemistry: Hyperglycemia under control  Abnormality of gait: Uses a cane. No recent falls.  Unspecified hypothyroidism: Most recent TSH was high. Will need adjustment in levothyroxine.  Chronic kidney disease, stage II (mild): Stable  Other and unspecified hyperlipidemia: Controlled  Essential and other specified forms of tremor: Unchanged  Reflux esophagitis: Asymptomatic   Past Medical History  Diagnosis Date  . CHF (congestive heart failure) 01/04/2010  . Atrial fib/flutter, transient 10/10/2011  . Hypertension 12/27/2009  . Other abnormal blood chemistry 03/24/2012    Hyperglycemia  . Temporomandibular joint sounds on opening and/or closing the jaw 10/15/2011  . Hypertrophy of prostate with urinary obstruction and other lower urinary tract symptoms (LUTS) 03/19/2011  . Abnormality of gait 12/11/2010  . Nocturia 12/11/2010  . Unspecified urinary incontinence 12/11/2010  . Unspecified hypothyroidism 06/05/2010  . Chronic kidney disease, stage II (mild) 06/05/2010  . Shortness of breath 02/20/2010  . Hearing loss 01/06/2010  . Debility, unspecified 01/04/2010  . Long term (current) use of  anticoagulants 01/04/2010  . Other and unspecified hyperlipidemia 12/27/2009  . Essential and other specified forms of tremor 12/27/2009  . Senile cataract, unspecified 12/27/2009  . Coronary atherosclerosis of native coronary artery 01/07/2003  . Reflux esophagitis 01/07/2003  . Diaphragmatic hernia without mention of obstruction or gangrene 01/07/2003    Past Surgical History  Procedure Laterality Date  . Cardiac surgery    . Appendectomy    . Inguinal hernia repair Right 1940  . Inguinal hernia repair Right 1990    Re-do with mesh     Procedures: 2004: Cardiac Cath/PTCA: RCA stenosis, LVEF 60%. Stent placed RCA. Barium swallow: Hiatal hernia, gastric-reflux. 12/2009: 2D echocardiogram: LVEF 65-70%, mod. Mitral regurgitation, mild LA enlargement,normal RV function /Systolic pressure. 12/29/09: Mild perihilar bronchitic changes bilaterally. Low lung volumes, mod. bibasilar airspace disease.Likely Atx., cannot R/o early infection. 01/01/10: Resolving bibasilar airspace disease. Stable cardiomegaly, hiatal hernia. 01/05/10 CXR: Left basilar atelectasis vs. infiltrate.  10/07/11 CXR: no acute abnormalities 10/10/11 CXR: Mild patchy atx improved. No pneumonitis or consolidate 03/05/2012 Arterial blood gas p02 79.7, Acid-bas deficit 2.7 03/05/2012 Pulmonary Function Study good patient effort and cooperation. The results of the test appear to be valid although the ATS standard for three acceptable maneuvers was not met. HHN given with 2.5 mg of Albuterol. Hgb is 12.3 g/dl as of from a ABG done 06/09/15.  03/08/2011 Chest x-ray no acute infiltrate. Stable chronic mild interstitial prominence. No convincing pulmonary edema. Bilateral basilar streaky atelectasis or scarring.   Consultants: Dr. Patsi Sears Urologist  Dr. Londell Moh Dermatologist    [facility] Dr.E. Bradly Bienenstock Dentist    Dr. Wylene Simmer  Cardiologist     Current Outpatient Prescriptions  Medication Sig Dispense Refill  .  acetaminophen (TYLENOL) 500 MG tablet Take 500 mg by mouth every 6 (six) hours as needed. For headache or pain.       . calcium carbonate (OS-CAL - DOSED IN MG OF ELEMENTAL CALCIUM) 1250 MG tablet Take 1 tablet by mouth daily.        . dabigatran (PRADAXA) 75 MG CAPS Take one tablet twice a day once in the morning and once in the evening for anticoagulation  60 capsule  5  . diltiazem (CARDIZEM CD) 120 MG 24 hr capsule Take 120 mg by mouth daily. Take 1 tablet daily to control heart rhythm.      . finasteride (PROSCAR) 5 MG tablet Take one tablet once a day for prostate  30 tablet  5  . levothyroxine (SYNTHROID, LEVOTHROID) 50 MCG tablet TAKE  ONE TABLET BY MOUTH DAILY FOR THYROID SUPPLEMENT  90 tablet  3  . Multiple Vitamins-Minerals (PRESERVISION AREDS PO) Take 1 tablet by mouth 2 (two) times daily.        . simvastatin (ZOCOR) 40 MG tablet Take 40 mg by mouth at bedtime. Take 1 tablet daily to treat hyperlipidemia.           Immunization History  Administered Date(s) Administered  . Influenza Whole 08/05/2012     Diet: Regular  History  Substance Use Topics  . Smoking status: Never Smoker   . Smokeless tobacco: Never Used  . Alcohol Use: 0.6 oz/week    1 Shots of liquor per week     Comment: scotch and water once a day     Family History Father deceased age 41 of pneumonia Mother deceased age 19 of natural causes 3 siblings: brother, Viviann Spare deceased an airplane crash. Sr., age and, deceased of unknown causes. Sister, Myriam Jacobson, deceased of unknown causes 3 children: One son, Rashaud Ybarbo. Callaham living and well. Daughter, and, living and well. Daughter, Waynetta Sandy, living and well.  DATA OBTAINED: from patient, medical record GENERAL: Feels well   No fevers, fatigue, change in appetite or weight SKIN: No itch, rash or open wounds EYES: No eye pain, dryness or itching  No change in vision. Wears corrective lenses. EARS: No earache, tinnitus. Bilateral hearing loss. Hearing aids are  used. NOSE: No congestion, drainage or bleeding MOUTH/THROAT: No mouth or tooth pain  No sore throat No difficulty chewing or swallowing RESPIRATORY: No cough, wheezing. History of dyspnea on exertion. CARDIAC: No chest pain, palpitations  No edema. History of atrial fibrillation. CHEST/BREASTS: No discomfort, discharge or lumps in breasts GI: No abdominal pain  No N/V/D or constipation  No heartburn or reflux  GU: No dysuria, frequency or urgency  No change in urine volume or character No nocturia or change in stream   MUSCULOSKELETAL: No joint pain, swelling or stiffness  No back pain  No muscle ache, pain, weakness  Gait is unsteady. NEUROLOGIC: No dizziness, fainting, headache, numbness  No change in mental status. Has difficulty maintaining balance. PSYCHIATRIC: No feelings of anxiety, depression Sleeps well.  No behavior issue.   Physical Exam: Vital signs: BP 100/62  Pulse 60  Temp(Src) 97.6 F (36.4 C) (Oral)  Ht 5\' 4"  (1.626 m)  Wt 140 lb (63.504 kg)  BMI 24.02 kg/m2  GENERAL APPEARANCE: No acute distress, appropriately groomed, normal body habitus. Alert, pleasant, conversant. SKIN: Onychomycosis of toenails of both feet. Greater than 5 cm diameter lipoma of the occiput present greater than  50 years per patient. Sebaceous cyst of the right cheek. HEAD: Normocephalic, atraumatic EYES: Bilateral ectropion of the lower lids. Pupils round, reactive. EOMs intact.  EARS: External exam WNL, canals clear, TM WNL. Severe hearing loss in both ears. NOSE: No deformity or discharge. MOUTH/THROAT: Lips w/o lesions. Oral mucosa, tongue moist, w/o lesion. Oropharynx w/o redness or lesions. Full dentures, upper and lower. NECK: Supple, full ROM. No thyroid tenderness, enlargement or nodule LYMPHATICS: No head, neck or supraclavicular adenopathy RESPIRATORY: Breathing is even, unlabored. Lung sounds are clear and full.  CARDIOVASCULAR: Heart RRR. No murmur or extra heart sounds  ARTERIAL:  Absent popliteal pulses and dorsalis pedis and posterior tibial pulses bilaterally.  VENOUS: No varicosities. No venous stasis skin changes  EDEMA: Trace pedal edema bilaterally. GASTROINTESTINAL: Abdomen is soft, non-tender, not distended w/ normal bowel sounds. No hepatic or splenic enlargement. No mass, ventral or inguinal hernia.  RECTAL: No anal fissure, skin tag or external hemorrhoid. Sphincter tone WNL. Stool is brown, heme negative. GENITOURINARY: Bladder non tender, not distended.  MALE: No penile lesion or drainage. No scrotal edema, tenderness or mass.  MALE: No urethral discharge, vulva lesion, vaginal bleeding, or mass. No cystocele, no rectocele MUSCULOSKELETAL: Moves all extremities with full ROM, strength and tone. Back is without kyphosis, scoliosis or spinal process tenderness. Gait is steady NEUROLOGIC: Oriented to time, place, person. Cranial nerves 2-12 grossly intact, speech clear, no tremor. Patella, brachial DTR 2+. Vibratory sensation diminished bilaterally. PSYCHIATRIC: Mood and affect appropriate to situation   Screening Score  MMS    PHQ2 0  PHQ9     Fall Risk    BIMS    Laboratory Reports 01/01/10: WBC 8.3, Hgb. 11.0, Hct 32-2, Plt213 blood culture negative 01/04/10: INR 1.82 Glu 116, BUN 22, Cr. 1.76, Na 141, K +3.9. Protein 5.9. Albumin 2.5 BNP 167 01/05/10: INR 2.07 Glu 104, BUN 27, Cr. 1.8, Na 139, K+ 4.3 CXR: left basilar atx.vs infiltrate 01/10/10 INR 5.1  3/10 INR 3.3   3/14 INR 2.1  3/23 INR 1.6  03/08/2011 CBC: WBC 8.7, RBC 4.48, Hemoglobin CMP: 98, BUN 25, Creatinine 1.43 Lipid: Cholesterol 119, Triglycerides 100. HDL 51, VLDL 20, VLDL 20, LDL 48 09/06/2011 BMP: glucose 103, BUN 30, Creatinine 1.38 Lipid; cholesterol 122, triglyceride 95, HDL 50, LDL 53 TSH 6.132 HOSP LAB (ED visit) 10/07/11 Glu 123, BUN 22, Cr. 1.4, na 136, K+ 4.1 U/A: mod. blood, trace ketone , 30protein, neg leuk.esterase, few bacteria  10/10/11 WBC 9.3, Hgb 12.2, Hct 36.1, Plt  171 Glu 124, BUN 28, Cr. 1.42, Na 136, K+ 4.2 08/19/2012 Lipid: cholesterol 126, triglyceride 74, HDL 56, LDL 55 TSH 4.410 HgbA1c 6.0  BNP 199.5 12/25/2012 BMP: Sodium 140, Potassium 4.8, glucose 99, BUN 25, Creatinine 1.30 HgbA1c 6.3  04/28/2013 CMP: Glucose 114, BUN 26, creatinine 1.27   Lipid panel: TC 112, trig 78, HDL, LDL 36   TSH 9.676   W0J 6.1   Urine microalbumin 1.84  Annual summary: Hospitalizations: None last year Infection History: None of significance Functional assessment: Continues to be in the independent living area. Areas of potential improvement: None likely Rehabilitation Potential: Functioning at highest achievable level Prognosis for survival: Fair  Plan: CHF (congestive heart failure), chronic, diastolic: Continue current medications  Hypertension: Continue current medication  Other abnormal blood chemistry: Repeat Glucose periodically  Abnormality of gait: Encouraged use of cane  Unspecified hypothyroidism: Increase levothyroxine. The TSH in 6 weeks.  Chronic kidney disease, stage II (mild): Continue to monitor  Other  and unspecified hyperlipidemia: Excellent control. Continue to monitor.  Essential and other specified forms of tremor: Mild. Does not interfere with life. No treatment needed.  Reflux esophagitis: Asymptomatic

## 2013-05-11 NOTE — Patient Instructions (Signed)
Increase levothyroxine as directed. Continue other medications.

## 2013-06-01 ENCOUNTER — Encounter: Payer: Self-pay | Admitting: Internal Medicine

## 2013-06-09 ENCOUNTER — Other Ambulatory Visit: Payer: Self-pay | Admitting: Internal Medicine

## 2013-07-28 LAB — BASIC METABOLIC PANEL
BUN: 24 mg/dL — AB (ref 4–21)
Creatinine: 1.4 mg/dL — AB (ref 0.6–1.3)
Glucose: 107 mg/dL
Potassium: 4.7 mmol/L (ref 3.4–5.3)
Sodium: 140 mmol/L (ref 137–147)

## 2013-08-03 ENCOUNTER — Non-Acute Institutional Stay: Payer: Medicare Other | Admitting: Internal Medicine

## 2013-08-03 ENCOUNTER — Encounter: Payer: Self-pay | Admitting: Internal Medicine

## 2013-08-03 VITALS — BP 114/60 | HR 120 | Ht 64.0 in | Wt 141.0 lb

## 2013-08-03 DIAGNOSIS — R739 Hyperglycemia, unspecified: Secondary | ICD-10-CM

## 2013-08-03 DIAGNOSIS — E039 Hypothyroidism, unspecified: Secondary | ICD-10-CM

## 2013-08-03 DIAGNOSIS — R269 Unspecified abnormalities of gait and mobility: Secondary | ICD-10-CM

## 2013-08-03 DIAGNOSIS — N182 Chronic kidney disease, stage 2 (mild): Secondary | ICD-10-CM

## 2013-08-03 DIAGNOSIS — R7309 Other abnormal glucose: Secondary | ICD-10-CM

## 2013-08-03 DIAGNOSIS — G250 Essential tremor: Secondary | ICD-10-CM

## 2013-08-03 DIAGNOSIS — I10 Essential (primary) hypertension: Secondary | ICD-10-CM

## 2013-08-03 DIAGNOSIS — I509 Heart failure, unspecified: Secondary | ICD-10-CM

## 2013-08-03 DIAGNOSIS — E785 Hyperlipidemia, unspecified: Secondary | ICD-10-CM

## 2013-08-03 NOTE — Patient Instructions (Signed)
Continue current medications. 

## 2013-08-03 NOTE — Progress Notes (Signed)
Subjective:    Patient ID: Larry Underwood, male    DOB: 31-Jul-1909, 79 y.o.   MRN: [id number]  HPI CHF (congestive heart failure): controled. Mild DOE.  Hypertension: controlled  Reflux esophagitis: asymptomatic  Unspecified hypothyroidism: rect elevation in the TSH. Had levothyroxine incresed to qd.  Chronic kidney disease, stage II (mild): unchanged  Hyperglycemia: stable  Essential and other specified forms of tremor;milod  Other and unspecified hyperlipidemia: needs recheck next visit  Abnormality of gait    Current Outpatient Prescriptions on File Prior to Visit  Medication Sig Dispense Refill  . acetaminophen (TYLENOL) 500 MG tablet Take 500 mg by mouth every 6 (six) hours as needed. For headache or pain.       . calcium carbonate (OS-CAL - DOSED IN MG OF ELEMENTAL CALCIUM) 1250 MG tablet Take 1 tablet by mouth daily.        . dabigatran (PRADAXA) 75 MG CAPS Take one tablet twice a day once in the morning and once in the evening for anticoagulation  60 capsule  5  . diltiazem (CARDIZEM CD) 120 MG 24 hr capsule TAKE  ONE CAPSULE BY MOUTH DAILY TO CONTROL HEART RHYTHM  30 capsule  3  . finasteride (PROSCAR) 5 MG tablet Take one tablet once a day for prostate  30 tablet  5  . levothyroxine (SYNTHROID, LEVOTHROID) 75 MCG tablet 1 daily for thyroid supplement  90 tablet  3  . Multiple Vitamins-Minerals (PRESERVISION AREDS PO) Take 1 tablet by mouth 2 (two) times daily.        . simvastatin (ZOCOR) 40 MG tablet Take 40 mg by mouth at bedtime. Take 1 tablet daily to treat hyperlipidemia.       No current facility-administered medications on file prior to visit.    Review of Systems  Constitutional: Negative for fever, activity change, appetite change, fatigue and unexpected weight change.       Larry Underwood elderly male.  HENT:       Bilateral hearing loss.  Eyes: Positive for visual disturbance.       Corrective lenses  Cardiovascular: Negative for chest pain,  palpitations and leg swelling.       AF  Gastrointestinal: Negative.  Negative for abdominal distention.  Endocrine:       Elevated glucose. Hypothyroid.  Genitourinary: Negative.   Musculoskeletal:       Generalized weakness. No pain. Unstable gait; using cane.  Skin: Negative for pallor, rash and wound.  Neurological: Positive for tremors. Negative for seizures, syncope, speech difficulty, weakness and numbness.       BET bilaterally.  Psychiatric/Behavioral: Negative.        Objective:BP 114/60  Pulse 120  Ht 5\' 4"  (1.626 m)  Wt 141 lb (63.957 kg)  BMI 24.19 kg/m2  SpO2 95%    Physical Exam  Constitutional: He is oriented to person, place, and time. No distress.  frail  HENT:  Head: Normocephalic and atraumatic.  Right Ear: External ear normal.  Left Ear: External ear normal.  Nose: Nose normal.  Mouth/Throat: Oropharynx is clear and moist.  Bilateral partial deafness. Full upper and lower dentures.  Eyes: Conjunctivae and EOM are normal. Pupils are equal, round, and reactive to light.  Bilateral lower lid ectropion.  Neck: No JVD present. No tracheal deviation present. No thyromegaly present.  Cardiovascular: Normal rate.  Exam reveals no gallop and no friction rub.   No murmur heard. AF Absent popliteal, DP, and PT pulses bilaterally  Pulmonary/Chest: No respiratory distress.  He has no wheezes. He has rales. He exhibits no tenderness.  Abdominal: He exhibits no distension and no mass. There is no tenderness.  Musculoskeletal: He exhibits no edema and no tenderness.  Vibratory sensatioin diminished bilaterally  Lymphadenopathy:    He has no cervical adenopathy.  Neurological: He is alert and oriented to person, place, and time. No cranial nerve deficit. Coordination normal.  Skin: No rash noted. No erythema. No pallor.  Onychomycosis lipoma of occiput Sebaceous cyst of th right cheek   Psychiatric: He has a normal mood and affect. His behavior is normal.  Judgment and thought content normal.    LAB REVIEWED 07/28/13 BMP: glu 107, BUN 24, Creat 1.39  TSH; 10.422     Assessment & Plan:  CHF (congestive heart failure): controlled  Hypertension: controlled  Reflux esophagitis: asymptomatic  Unspecified hypothyroidism: increased levothyroxinerecently  Chronic kidney disease, stage II (mild): unchanged  Hyperglycemia: stable  Essential and other specified forms of tremor: unchanged  Other and unspecified hyperlipidemia: recheck lab  Abnormality of gait: using cane

## 2013-08-05 ENCOUNTER — Other Ambulatory Visit: Payer: Self-pay | Admitting: Internal Medicine

## 2013-08-17 ENCOUNTER — Encounter: Payer: Self-pay | Admitting: Internal Medicine

## 2013-08-19 ENCOUNTER — Other Ambulatory Visit: Payer: Self-pay | Admitting: Internal Medicine

## 2013-09-14 ENCOUNTER — Other Ambulatory Visit: Payer: Self-pay | Admitting: Internal Medicine

## 2013-09-17 ENCOUNTER — Non-Acute Institutional Stay (SKILLED_NURSING_FACILITY): Payer: Medicare Other | Admitting: Geriatric Medicine

## 2013-09-17 ENCOUNTER — Encounter: Payer: Self-pay | Admitting: Geriatric Medicine

## 2013-09-17 DIAGNOSIS — R269 Unspecified abnormalities of gait and mobility: Secondary | ICD-10-CM

## 2013-09-17 LAB — BASIC METABOLIC PANEL: Sodium: 141 mmol/L (ref 137–147)

## 2013-09-21 NOTE — Progress Notes (Signed)
Patient ID: Larry Underwood, male   DOB: April 19, 1909, 79 y.o.   MRN: [id number]  [facility] [id number])  Code Status: Full Code, [facility] Contact Information   Name Relation Home Work Mobile   Touch,Ruth Spouse [phone]     Nolan, Tuazon [phone]  [phone]       Chief Complaint  Patient presents with  . Weakness    Reahab discharge    HPI: This 79 year old male resident of [facility], Independent Living section was brought to the rehabilitation section last evening by his son due to "weak knees". Son was assisting patient out of the car when he couldn't stand very well, was lowered to the ground. Security was called, patient was assisted into wheelchair and brought to the rehabilitation section. Son reported that patient had a fall in his home the day before was able to pick himself up. On-call provider was notified of admission, ordered lab and orthostatic blood pressure readings.  Patient has remained stable overnight, tells me he feels back to his usual self today. He is anxious to return to his independent living home. [facility] staff has had discussions with the patient's family and recommend 24-hour care in the home, family is in agreement by both patient and spouse declined.    No Known Allergies Medications    Medication List       This list is accurate as of: 09/17/13 11:59 PM.  Always use your most recent med list.               acetaminophen 500 MG tablet  Commonly known as:  TYLENOL  Take 500 mg by mouth every 6 (six) hours as needed. For headache or pain.     calcium carbonate 1250 MG tablet  Commonly known as:  OS-CAL - dosed in mg of elemental calcium  Take 1 tablet by mouth daily.     CARTIA XT 120 MG 24 hr capsule  Generic drug:  diltiazem  TAKE ONE CAPSULE BY MOUTH DAILY TO CONTROL HEART RHYTHM     finasteride 5 MG tablet  Commonly known as:  PROSCAR  TAKE ONE TABLET BY MOUTH ONCE DAILY FOR  PROSTATE     levothyroxine 75 MCG tablet  Commonly known as:  SYNTHROID, LEVOTHROID  1 daily for thyroid supplement     PRADAXA 75 MG Caps capsule  Generic drug:  dabigatran  TAKE 1 CAPLET BY MOUTH EVERY MORNING AND EVENING FOR ANTICOAGULATION.     PRESERVISION AREDS PO  Take 1 tablet by mouth 2 (two) times daily.     simvastatin 40 MG tablet  Commonly known as:  ZOCOR  TAKE 1 TABLET BY MOUTH DAILY       DATA REVIEWED  Radiologic Exams:   Cardiovascular Exams:   Laboratory Studies Lab Results  Component Value Date   NA 140 07/28/2013   K 4.7 07/28/2013   CREATININE 1.4* 07/28/2013   BUN 24* 07/28/2013        TSH 10.42* 07/28/2013   Lab Results  Component Value Date   TSH 3.51 09/08/2013   Lab Results  Component Value Date   NA 141 09/17/2013   K 4.5 09/17/2013   CREATININE 1.3 09/17/2013   BUN 25* 09/17/2013    Review of Systems  DATA OBTAINED: from patient, nurse, medical record,  GENERAL: Feels well   No fevers, fatigue, change in appetite or weight SKIN: No itch, rash Skin tear left forearm EYES: No eye pain, dryness or itching  No change in vision EARS: No earache, tinnitus, change in hearing NOSE: No congestion, drainage or bleeding MOUTH/THROAT: No mouth or tooth pain  No sore throat No difficulty chewing or swallowing RESPIRATORY: No cough, wheezing, SOB CARDIAC: No chest pain, palpitations  No edema. GI: No abdominal pain  No N/V/D or constipation   GU: No dysuria, frequency or urgency  No change in urine volume or character   MUSCULOSKELETAL: No joint pain, swelling or stiffness  No back pain  No muscle ache, pain, weakness  Gait is unsteady, frequent falls.  NEUROLOGIC: No dizziness, fainting, headache  No change in mental status.  PSYCHIATRIC: No feelings of anxiety, depression Sleeps well.     Physical Exam Filed Vitals:   09/17/13 1209  BP: 110/72  Pulse: 68  Temp: 97.1 F (36.2 C)  Resp: 27   There is no weight on file to calculate  BMI.  GENERAL APPEARANCE: No acute distress, appropriately groomed, Frail body habitus. Alert, pleasant, conversant. SKIN: No diaphoresis, rash, clean dressing left forearm HEAD: Normocephalic, atraumatic EYES: Conjunctiva/lids clear. Pupils round, reactive. EARS: External exam WNL Poor Hearing  NOSE: No deformity or discharge. MOUTH/THROAT: Lips w/o lesions. Oral mucosa, tongue moist, w/o lesion. Oropharynx w/o redness or lesions.  NECK: Supple, full ROM. No thyroid tenderness, enlargement or nodule LYMPHATICS: No head, neck or supraclavicular adenopathy RESPIRATORY: Breathing is even, unlabored. Lung sounds are clear and full.  CARDIOVASCULAR: Heart RRR. No murmur or extra heart sounds  EDEMA: No peripheral or periorbital edema. No ascites GASTROINTESTINAL: Abdomen is soft, non-tender, not distended w/ normal bowel sounds. No hepatic or splenic enlargement. No mass, ventral or inguinal hernia. MUSCULOSKELETAL: Moves all extremities slowly,  with full ROM. Decreased strength bilateral LE. Back with kyphosis, No scoliosis or spinal process tenderness. Gait is very steady, requires support to stay upright NEUROLOGIC: Oriented to time, place, person. Cranial nerves 2-12 grossly intact, speech clear, no tremor. PSYCHIATRIC: Does not appear to understand potential danger due to poor balance/ gait  ASSESSMENT/PLAN  Abnormality of gait Very unsteady gait, leg weakness. Patient has had PT interventions in the past, has been unwilling to follow safety recommendations. The patient is determined to discharge to his independent living home today. Have recommended he obtains a walker prior to discharge, have recommended use of walker with all ambulation.   Follow up: As scheduled in [facility]  Giavana Rooke T.Princella Jaskiewicz, NP-C 09/21/2013

## 2013-09-21 NOTE — Assessment & Plan Note (Signed)
Very unsteady gait, leg weakness. Patient has had PT interventions in the past, has been unwilling to follow safety recommendations. The patient is determined to discharge to his independent living home today. Have recommended he obtains a walker prior to discharge, have recommended use of walker with all ambulation.

## 2013-09-28 ENCOUNTER — Other Ambulatory Visit: Payer: Self-pay | Admitting: Internal Medicine

## 2013-10-27 LAB — BASIC METABOLIC PANEL
BUN: 22 mg/dL — AB (ref 4–21)
Creatinine: 1.3 mg/dL (ref 0.6–1.3)
Glucose: 109 mg/dL
Potassium: 4.9 mmol/L (ref 3.4–5.3)

## 2013-10-27 LAB — TSH: TSH: 6.77 u[IU]/mL — AB (ref 0.41–5.90)

## 2013-11-02 ENCOUNTER — Encounter: Payer: Self-pay | Admitting: Internal Medicine

## 2013-11-02 ENCOUNTER — Non-Acute Institutional Stay: Payer: Medicare Other | Admitting: Internal Medicine

## 2013-11-02 VITALS — BP 118/70 | HR 68 | Wt 139.0 lb

## 2013-11-02 DIAGNOSIS — E785 Hyperlipidemia, unspecified: Secondary | ICD-10-CM

## 2013-11-02 DIAGNOSIS — R739 Hyperglycemia, unspecified: Secondary | ICD-10-CM

## 2013-11-02 DIAGNOSIS — R269 Unspecified abnormalities of gait and mobility: Secondary | ICD-10-CM

## 2013-11-02 DIAGNOSIS — N182 Chronic kidney disease, stage 2 (mild): Secondary | ICD-10-CM

## 2013-11-02 DIAGNOSIS — E039 Hypothyroidism, unspecified: Secondary | ICD-10-CM

## 2013-11-02 DIAGNOSIS — I509 Heart failure, unspecified: Secondary | ICD-10-CM

## 2013-11-02 DIAGNOSIS — I10 Essential (primary) hypertension: Secondary | ICD-10-CM

## 2013-11-02 DIAGNOSIS — R7309 Other abnormal glucose: Secondary | ICD-10-CM

## 2013-11-02 NOTE — Progress Notes (Signed)
Patient ID: Larry Underwood, male   DOB: 11-15-1908, 79 y.o.   MRN: [id number]    Location:  [facility] [employer] of Service: Clinic (12)    No Known Allergies  Chief Complaint  Patient presents with  . Medical Managment of Chronic Issues    blood pressure, thyroid, hyperglycemia    HPI:  Dyspnea when walking very far  CHF (congestive heart failure): stable  Hypertension: controlled  Unspecified hypothyroidism: mild elevation in the TSH.  Chronic kidney disease, stage II (mild): unchanged  Other and unspecified hyperlipidemia: well controlled  Abnormality of gait:using cane  Hyperglycemia: controlled    Medications: Patient's Medications  New Prescriptions   No medications on file  Previous Medications   ACETAMINOPHEN (TYLENOL) 500 MG TABLET    Take 500 mg by mouth every 6 (six) hours as needed. For headache or pain.    CALCIUM CARBONATE (OS-CAL - DOSED IN MG OF ELEMENTAL CALCIUM) 1250 MG TABLET    Take 1 tablet by mouth daily.     CARTIA XT 120 MG 24 HR CAPSULE    TAKE ONE CAPSULE BY MOUTH DAILY TO CONTROL HEART RHYTHM   FINASTERIDE (PROSCAR) 5 MG TABLET    TAKE ONE TABLET BY MOUTH ONCE DAILY FOR PROSTATE   LEVOTHYROXINE (SYNTHROID, LEVOTHROID) 75 MCG TABLET    1 daily for thyroid supplement   MULTIPLE VITAMINS-MINERALS (PRESERVISION AREDS PO)    Take 1 tablet by mouth 2 (two) times daily.     PRADAXA 75 MG CAPS CAPSULE    TAKE 1 CAPLET BY MOUTH EVERY MORNING AND EVENING FOR ANTICOAGULATION.   SIMVASTATIN (ZOCOR) 40 MG TABLET    TAKE 1 TABLET BY MOUTH DAILY  Modified Medications   No medications on file  Discontinued Medications   No medications on file     Review of Systems  Constitutional: Negative for fever, activity change, appetite change, fatigue and unexpected weight change.       Larry Underwood elderly male.  HENT:       Bilateral hearing loss.  Eyes: Positive for visual disturbance.       Corrective lenses  Cardiovascular: Negative  for chest pain, palpitations and leg swelling.       AF  Gastrointestinal: Negative.  Negative for abdominal distention.  Endocrine:       Elevated glucose. Hypothyroid.  Genitourinary: Negative.   Musculoskeletal:       Generalized weakness. No pain. Unstable gait; using cane.  Skin: Negative for pallor, rash and wound.  Neurological: Positive for tremors. Negative for seizures, syncope, speech difficulty, weakness and numbness.       BET bilaterally.  Psychiatric/Behavioral: Negative.     Filed Vitals:   11/02/13 1600  BP: 118/70  Pulse: 68  Weight: 139 lb (63.05 kg)   Physical Exam  Constitutional: He is oriented to person, place, and time. No distress.  frail  HENT:  Head: Normocephalic and atraumatic.  Right Ear: External ear normal.  Left Ear: External ear normal.  Nose: Nose normal.  Mouth/Throat: Oropharynx is clear and moist.  Bilateral partial deafness. Full upper and lower dentures.  Eyes: Conjunctivae and EOM are normal. Pupils are equal, round, and reactive to light.  Bilateral lower lid ectropion.  Neck: No JVD present. No tracheal deviation present. No thyromegaly present.  Cardiovascular: Normal rate, normal heart sounds and intact distal pulses.  Exam reveals no gallop and no friction rub.   No murmur heard. AF Absent popliteal, DP, and PT pulses bilaterally  Pulmonary/Chest: No respiratory distress. He has no wheezes. He has rales. He exhibits no tenderness.  Abdominal: He exhibits no distension and no mass. There is no tenderness.  Musculoskeletal: He exhibits edema. He exhibits no tenderness.  Vibratory sensatioin diminished bilaterally  Lymphadenopathy:    He has no cervical adenopathy.  Neurological: He is alert and oriented to person, place, and time. No cranial nerve deficit. Coordination normal.  Skin: No rash noted. No erythema. No pallor.  Onychomycosis lipoma of occiput Sebaceous cyst of th right cheek   Psychiatric: He has a normal mood  and affect. His behavior is normal. Judgment and thought content normal.     Labs reviewed: Nursing Home on 09/17/2013  Component Date Value Range Status  . Glucose 07/28/2013 107   Final  . BUN 07/28/2013 24* 4 - 21 mg/dL Final  . Creatinine 16/08/9603 1.4* 0.6 - 1.3 mg/dL Final  . Potassium 54/07/8118 4.7  3.4 - 5.3 mmol/L Final  . Sodium 07/28/2013 140  137 - 147 mmol/L Final  . TSH 07/28/2013 10.42* 0.41 - 5.90 uIU/mL Final  . TSH 09/08/2013 3.51  0.41 - 5.90 uIU/mL Final  . Glucose 09/17/2013 141   Final  . BUN 09/17/2013 25* 4 - 21 mg/dL Final  . Creatinine 14/78/2956 1.3  0.6 - 1.3 mg/dL Final  . Potassium 21/30/8657 4.5  3.4 - 5.3 mmol/L Final  . Sodium 09/17/2013 141  137 - 147 mmol/L Final  07/28/13 BMP: glu 107, BUN 24, Creat 1.39  TSH; 10.422      Assessment/Plan CHF (congestive heart failure): controlled  Hypertension: controlled  Unspecified hypothyroidism: TSH has improved since 3 mo ago  Chronic kidney disease, stage II (mild): stable  Other and unspecified hyperlipidemia:well controlled  Abnormality of gait: unchanged  Hyperglycemia:controlled

## 2013-11-11 ENCOUNTER — Encounter: Payer: Self-pay | Admitting: Internal Medicine

## 2013-11-16 ENCOUNTER — Other Ambulatory Visit: Payer: Self-pay | Admitting: Internal Medicine

## 2013-12-21 ENCOUNTER — Other Ambulatory Visit: Payer: Self-pay | Admitting: Nurse Practitioner

## 2014-01-28 LAB — BASIC METABOLIC PANEL
BUN: 26 mg/dL — AB (ref 4–21)
CREATININE: 1.3 mg/dL (ref 0.6–1.3)
GLUCOSE: 99 mg/dL
Potassium: 4.7 mmol/L (ref 3.4–5.3)
SODIUM: 138 mmol/L (ref 137–147)

## 2014-01-28 LAB — LIPID PANEL
Cholesterol: 106 mg/dL (ref 0–200)
HDL: 47 mg/dL (ref 35–70)
LDL CALC: 40 mg/dL
Triglycerides: 71 mg/dL (ref 40–160)

## 2014-01-28 LAB — HEMOGLOBIN A1C: Hgb A1c MFr Bld: 6.5 % — AB (ref 4.0–6.0)

## 2014-01-28 LAB — TSH: TSH: 4.82 u[IU]/mL (ref 0.41–5.90)

## 2014-02-01 ENCOUNTER — Encounter: Payer: Self-pay | Admitting: Internal Medicine

## 2014-02-08 ENCOUNTER — Encounter: Payer: Self-pay | Admitting: Internal Medicine

## 2014-02-08 ENCOUNTER — Non-Acute Institutional Stay: Payer: Medicare Other | Admitting: Internal Medicine

## 2014-02-08 VITALS — BP 112/62 | HR 54 | Temp 97.5°F | Wt 140.0 lb

## 2014-02-08 DIAGNOSIS — G252 Other specified forms of tremor: Secondary | ICD-10-CM

## 2014-02-08 DIAGNOSIS — R7309 Other abnormal glucose: Secondary | ICD-10-CM

## 2014-02-08 DIAGNOSIS — R269 Unspecified abnormalities of gait and mobility: Secondary | ICD-10-CM

## 2014-02-08 DIAGNOSIS — I10 Essential (primary) hypertension: Secondary | ICD-10-CM

## 2014-02-08 DIAGNOSIS — E785 Hyperlipidemia, unspecified: Secondary | ICD-10-CM

## 2014-02-08 DIAGNOSIS — E039 Hypothyroidism, unspecified: Secondary | ICD-10-CM

## 2014-02-08 DIAGNOSIS — N182 Chronic kidney disease, stage 2 (mild): Secondary | ICD-10-CM

## 2014-02-08 DIAGNOSIS — I509 Heart failure, unspecified: Secondary | ICD-10-CM

## 2014-02-08 DIAGNOSIS — R739 Hyperglycemia, unspecified: Secondary | ICD-10-CM

## 2014-02-08 DIAGNOSIS — G250 Essential tremor: Secondary | ICD-10-CM

## 2014-02-08 NOTE — Progress Notes (Signed)
Patient ID: Larry Underwood, male   DOB: 02/01/1909, 80 y.o.   MRN: [id number]    Location:  [facility]  Place of Service: CLINIC    No Known Allergies  Chief Complaint  Patient presents with  . Medical Managment of Chronic Issues    3 Month follow up for CHF, BP, Thyroid, Hyperglycemia    HPI:  Hypertension: CONTROLLED  CHF (congestive heart failure): CONTROLLED  Unspecified hypothyroidism: compensated  Chronic kidney disease, stage II (mild): unchanged  Essential and other specified forms of tremor: unchanged  Other and unspecified hyperlipidemia: controlled  Hyperglycemia: controlled  Abnormality of gait: having more difficulty on standing    Medications: Patient's Medications  New Prescriptions   No medications on file  Previous Medications   ACETAMINOPHEN (TYLENOL) 500 MG TABLET    Take 500 mg by mouth every 6 (six) hours as needed. For headache or pain.    CALCIUM CARBONATE (OS-CAL - DOSED IN MG OF ELEMENTAL CALCIUM) 1250 MG TABLET    Take 1 tablet by mouth daily.     CARTIA XT 120 MG 24 HR CAPSULE    TAKE ONE CAPSULE BY MOUTH DAILY TO CONTROL HEART RHYTHM   FINASTERIDE (PROSCAR) 5 MG TABLET    TAKE ONE TABLET BY MOUTH ONCE DAILY FOR PROSTATE   LEVOTHYROXINE (SYNTHROID, LEVOTHROID) 75 MCG TABLET    1 daily for thyroid supplement   MULTIPLE VITAMINS-MINERALS (PRESERVISION AREDS PO)    Take 1 tablet by mouth 2 (two) times daily.     PRADAXA 75 MG CAPS CAPSULE    TAKE ONE CAPSULE BY MOUTH EVERY MORNING AND EVERY EVENING FOR ANTICOAGLATION   SIMVASTATIN (ZOCOR) 40 MG TABLET    TAKE 1 TABLET BY MOUTH EVERY DAY  Modified Medications   No medications on file  Discontinued Medications   No medications on file     Review of Systems  Constitutional: Negative for fever, activity change, appetite change, fatigue and unexpected weight change.       Larry Underwood elderly male.  HENT:       Bilateral hearing loss.  Eyes: Positive for visual disturbance.       Corrective lenses    Cardiovascular: Negative for chest pain, palpitations and leg swelling.       AF  Gastrointestinal: Negative.  Negative for abdominal distention.  Endocrine:       Elevated glucose. Hypothyroid.  Genitourinary: Negative.   Musculoskeletal:       Generalized weakness. No pain. Unstable gait; using cane.  Skin: Negative for pallor, rash and wound.  Neurological: Positive for tremors. Negative for seizures, syncope, speech difficulty, weakness and numbness.       BET bilaterally.  Psychiatric/Behavioral: Negative.     Filed Vitals:   02/08/14 1605  BP: 112/62  Pulse: 54  Temp: 97.5 F (36.4 C)  TempSrc: Oral  Weight: 140 lb (63.504 kg)   Physical Exam  Constitutional: He is oriented to person, place, and time. No distress.  frail  HENT:  Head: Normocephalic and atraumatic.  Right Ear: External ear normal.  Left Ear: External ear normal.  Nose: Nose normal.  Mouth/Throat: Oropharynx is clear and moist.  Bilateral partial deafness. Full upper and lower dentures.  Eyes: Conjunctivae and EOM are normal. Pupils are equal, round, and reactive to light.  Bilateral lower lid ectropion.  Neck: No JVD present. No tracheal deviation present. No thyromegaly present.  Cardiovascular: Normal rate, normal heart sounds and intact distal pulses.  Exam reveals no gallop and no  friction rub.   No murmur heard. AF Absent popliteal, DP, and PT pulses bilaterally  Pulmonary/Chest: No respiratory distress. He has no wheezes. He has rales. He exhibits no tenderness.  Abdominal: He exhibits no distension and no mass. There is no tenderness.  Musculoskeletal: He exhibits edema. He exhibits no tenderness.  Vibratory sensatioin diminished bilaterally  Lymphadenopathy:    He has no cervical adenopathy.  Neurological: He is alert and oriented to person, place, and time. No cranial nerve deficit. Coordination normal.  Skin: No rash noted. No erythema. No pallor.  Onychomycosis lipoma of  occiput Sebaceous cyst of th right cheek   Psychiatric: He has a normal mood and affect. His behavior is normal. Judgment and thought content normal.     Labs reviewed: Nursing Home on 02/08/2014  Component Date Value Ref Range Status  . Glucose 01/28/2014 99   Final  . BUN 01/28/2014 26* 4 - 21 mg/dL Final  . Creatinine 16/10/960403/26/2015 1.3  0.6 - 1.3 mg/dL Final  . Potassium 54/09/811903/26/2015 4.7  3.4 - 5.3 mmol/L Final  . Sodium 01/28/2014 138  137 - 147 mmol/L Final  . Triglycerides 01/28/2014 71  40 - 160 mg/dL Final  . Cholesterol 14/78/295603/26/2015 106  0 - 200 mg/dL Final  . HDL 21/30/865703/26/2015 47  35 - 70 mg/dL Final  . LDL Cholesterol 01/28/2014 40   Final  . Hemoglobin A1C 01/28/2014 6.5* 4.0 - 6.0 % Final  . TSH 01/28/2014 4.82  0.41 - 5.90 uIU/mL Final      Assessment/Plan  1. Hypertension controlled  2. CHF (congestive heart failure) compensated  3. Unspecified hypothyroidism compensated  4. Chronic kidney disease, stage II (mild) unchanged  5. Essential and other specified forms of tremor unchanged  6. Other and unspecified hyperlipidemia controlled  7. Hyperglycemia controlled  8. Abnormality of gait Getting weaker

## 2014-03-01 ENCOUNTER — Encounter: Payer: Self-pay | Admitting: *Deleted

## 2014-03-04 ENCOUNTER — Encounter: Payer: Self-pay | Admitting: *Deleted

## 2014-03-29 ENCOUNTER — Encounter: Payer: Self-pay | Admitting: Internal Medicine

## 2014-04-26 ENCOUNTER — Other Ambulatory Visit: Payer: Self-pay | Admitting: Internal Medicine

## 2014-05-18 ENCOUNTER — Other Ambulatory Visit: Payer: Self-pay | Admitting: Internal Medicine

## 2014-05-27 ENCOUNTER — Other Ambulatory Visit: Payer: Self-pay | Admitting: Internal Medicine

## 2014-06-07 ENCOUNTER — Other Ambulatory Visit: Payer: Self-pay | Admitting: Internal Medicine

## 2014-06-14 ENCOUNTER — Encounter: Payer: Medicare Other | Admitting: Internal Medicine

## 2014-06-28 ENCOUNTER — Other Ambulatory Visit: Payer: Self-pay | Admitting: Nurse Practitioner

## 2014-07-19 ENCOUNTER — Other Ambulatory Visit: Payer: Self-pay | Admitting: Internal Medicine

## 2014-07-22 DIAGNOSIS — Z029 Encounter for administrative examinations, unspecified: Secondary | ICD-10-CM

## 2014-07-27 ENCOUNTER — Telehealth: Payer: Self-pay | Admitting: Internal Medicine

## 2014-07-27 NOTE — Telephone Encounter (Signed)
Mr. Larry Underwood daughter dropped off a dmv form to be filled out by Dr. Chilton Si.

## 2014-07-29 NOTE — Telephone Encounter (Signed)
Given to Dr. Chilton Si to review and fill out

## 2014-08-09 ENCOUNTER — Non-Acute Institutional Stay: Payer: Medicare Other | Admitting: Internal Medicine

## 2014-08-09 ENCOUNTER — Encounter: Payer: Self-pay | Admitting: Internal Medicine

## 2014-08-09 VITALS — BP 110/62 | HR 72 | Temp 97.7°F | Wt 137.0 lb

## 2014-08-09 DIAGNOSIS — I5032 Chronic diastolic (congestive) heart failure: Secondary | ICD-10-CM

## 2014-08-09 DIAGNOSIS — G252 Other specified forms of tremor: Secondary | ICD-10-CM

## 2014-08-09 DIAGNOSIS — I10 Essential (primary) hypertension: Secondary | ICD-10-CM

## 2014-08-09 DIAGNOSIS — E039 Hypothyroidism, unspecified: Secondary | ICD-10-CM

## 2014-08-09 DIAGNOSIS — R269 Unspecified abnormalities of gait and mobility: Secondary | ICD-10-CM

## 2014-08-09 DIAGNOSIS — R251 Tremor, unspecified: Secondary | ICD-10-CM

## 2014-08-09 DIAGNOSIS — E785 Hyperlipidemia, unspecified: Secondary | ICD-10-CM

## 2014-08-09 DIAGNOSIS — R739 Hyperglycemia, unspecified: Secondary | ICD-10-CM

## 2014-08-09 DIAGNOSIS — G250 Essential tremor: Secondary | ICD-10-CM

## 2014-08-09 DIAGNOSIS — N182 Chronic kidney disease, stage 2 (mild): Secondary | ICD-10-CM

## 2014-08-09 NOTE — Progress Notes (Signed)
Patient ID: Larry RouxRobert D Underwood, male   DOB: 08/04/1909, 80 y.o.   MRN: [id number]    [facility]     [facility] (12[facility]     No Known Allergies  Chief Complaint  Patient presents with  . Medical Management of Chronic Issues    blood pressure, CHF, gait.  Walks with walker    HPI:  Essential hypertension: controlled  Chronic kidney disease, stage II (mild): stable  Hyperglycemia: stable  Essential and other specified forms of tremor: unchanged  Hypothyroidism, unspecified hypothyroidism type: compensated  Hyperlipidemia: controlled  Chronic diastolic congestive heart failure: compensated  Abnormality of gait: using rolling walker    Medications: Patient's Medications  New Prescriptions   No medications on file  Previous Medications   ACETAMINOPHEN (TYLENOL) 500 MG TABLET    Take 500 mg by mouth every 6 (six) hours as needed. For headache or pain.    CARTIA XT 120 MG 24 HR CAPSULE    TAKE ONE CAPSULE BY MOUTH DAILY TO CONTROL HEART RHYTHM   FINASTERIDE (PROSCAR) 5 MG TABLET    TAKE 1 TABLET BY MOUTH DAILY FOR PROSTATE   LEVOTHYROXINE (SYNTHROID, LEVOTHROID) 75 MCG TABLET    1 daily for thyroid supplement   MULTIPLE VITAMINS-MINERALS (PRESERVISION AREDS PO)    Take 1 tablet by mouth 2 (two) times daily.     PRADAXA 75 MG CAPS CAPSULE    TAKE 1 CAPSULE BY MOUTH EVERY MORNING AND EVERY EVENING FOR ANTICOAGLATION   SIMVASTATIN (ZOCOR) 40 MG TABLET    TAKE 1 TABLET BY MOUTH EVERY DAY  Modified Medications   No medications on file  Discontinued Medications   CALCIUM CARBONATE (OS-CAL - DOSED IN MG OF ELEMENTAL CALCIUM) 1250 MG TABLET    Take 1 tablet by mouth daily.     CARTIA XT 120 MG 24 HR CAPSULE    TAKE 1 CAPSULE BY MOUTH DAILY TO CONTROL HEART RHYTHM   FINASTERIDE (PROSCAR) 5 MG TABLET    TAKE ONE TABLET BY MOUTH ONCE DAILY FOR PROSTATE     Review of Systems  Constitutional: Negative for fever, activity change, appetite  change, fatigue and unexpected weight change.       Gilman SchmidtFraili elderly male.  HENT:       Bilateral hearing loss.  Eyes: Positive for visual disturbance.       Corrective lenses  Cardiovascular: Negative for chest pain, palpitations and leg swelling.       AF  Gastrointestinal: Negative.  Negative for abdominal distention.  Endocrine:       Elevated glucose. Hypothyroid.  Genitourinary: Negative.   Musculoskeletal:       Generalized weakness. No pain. Unstable gait; using cane.  Skin: Negative for pallor, rash and wound.  Neurological: Positive for tremors. Negative for seizures, syncope, speech difficulty, weakness and numbness.       BET bilaterally.  Psychiatric/Behavioral: Negative.     Filed Vitals:   08/09/14 1605  BP: 110/62  Pulse: 72  Temp: 97.7 F (36.5 C)  TempSrc: Oral  Weight: 137 lb (62.143 kg)  SpO2: 96%   Body mass index is 23.5 kg/(m^2).  Physical Exam  Constitutional: He is oriented to person, place, and time. No distress.  frail  HENT:  Head: Normocephalic and atraumatic.  Right Ear: External ear normal.  Left Ear: External ear normal.  Nose: Nose normal.  Mouth/Throat: Oropharynx is clear and moist.  Bilateral partial deafness. Full upper and lower dentures.  Eyes: Conjunctivae and  EOM are normal. Pupils are equal, round, and reactive to light.  Bilateral lower lid ectropion.  Neck: No JVD present. No tracheal deviation present. No thyromegaly present.  Cardiovascular: Normal rate, normal heart sounds and intact distal pulses.  Exam reveals no gallop and no friction rub.   No murmur heard. AF Absent popliteal, DP, and PT pulses bilaterally  Pulmonary/Chest: No respiratory distress. He has no wheezes. He has rales. He exhibits no tenderness.  Abdominal: He exhibits no distension and no mass. There is no tenderness.  Musculoskeletal: He exhibits edema. He exhibits no tenderness.  Vibratory sensatioin diminished bilaterally  Lymphadenopathy:    He  has no cervical adenopathy.  Neurological: He is alert and oriented to person, place, and time. No cranial nerve deficit. Coordination normal.  Skin: No rash noted. No erythema. No pallor.  Onychomycosis lipoma of occiput Sebaceous cyst of th right cheek   Psychiatric: He has a normal mood and affect. His behavior is normal. Judgment and thought content normal.     Labs reviewed: No visits with results within 3 Month(s) from this visit. Latest known visit with results is:  Nursing Home on 02/08/2014  Component Date Value Ref Range Status  . Glucose 01/28/2014 99   Final  . BUN 01/28/2014 26* 4 - 21 mg/dL Final  . Creatinine 16/08/9603 1.3  0.6 - 1.3 mg/dL Final  . Potassium 54/07/8118 4.7  3.4 - 5.3 mmol/L Final  . Sodium 01/28/2014 138  137 - 147 mmol/L Final  . Triglycerides 01/28/2014 71  40 - 160 mg/dL Final  . Cholesterol 14/78/2956 106  0 - 200 mg/dL Final  . HDL 21/30/8657 47  35 - 70 mg/dL Final  . LDL Cholesterol 01/28/2014 40   Final  . Hemoglobin A1C 01/28/2014 6.5* 4.0 - 6.0 % Final  . TSH 01/28/2014 4.82  0.41 - 5.90 uIU/mL Final     Assessment/Plan  1. Essential hypertension -CMP  2. Chronic kidney disease, stage II (mild) -CMP  3. Hyperglycemia -CMP  4. Essential and other specified forms of tremor Unchanged  5. Hypothyroidism, unspecified hypothyroidism type -TSH  6. Hyperlipidemia -LIPIDS  7. Chronic diastolic congestive heart failure -at his age and currently doing well, I do not think 2D Echo is warranted  8. Abnormality of gait Continue walker   Return : [facility] in 6 mo  Lab: CBC, TSH, CMP, Lipids, EKG

## 2014-08-20 ENCOUNTER — Other Ambulatory Visit: Payer: Self-pay | Admitting: Internal Medicine

## 2014-08-24 ENCOUNTER — Encounter: Payer: Self-pay | Admitting: Internal Medicine

## 2014-08-24 NOTE — Telephone Encounter (Signed)
Daughter, Susan called and stated that she needs a letter to go with the DMV Forms you filled out yesterday stating that you recommend that patient do not drive and list the reasons you stated on forms. Needs for both mom and dad. Would like to pick up today. Please Advise. 

## 2014-08-25 NOTE — Telephone Encounter (Signed)
Letter printed and left up front for caregiver to pick up

## 2014-08-30 ENCOUNTER — Other Ambulatory Visit: Payer: Self-pay | Admitting: Internal Medicine

## 2014-09-17 ENCOUNTER — Other Ambulatory Visit: Payer: Self-pay | Admitting: Internal Medicine

## 2014-10-20 ENCOUNTER — Non-Acute Institutional Stay: Payer: Medicare Other | Admitting: Nurse Practitioner

## 2014-10-20 ENCOUNTER — Encounter: Payer: Self-pay | Admitting: Nurse Practitioner

## 2014-10-20 VITALS — BP 110/58 | HR 68 | Temp 98.5°F | Resp 24

## 2014-10-20 DIAGNOSIS — R05 Cough: Secondary | ICD-10-CM

## 2014-10-20 DIAGNOSIS — R059 Cough, unspecified: Secondary | ICD-10-CM

## 2014-10-20 DIAGNOSIS — R410 Disorientation, unspecified: Secondary | ICD-10-CM

## 2014-10-20 NOTE — Progress Notes (Signed)
Patient ID: Larry RouxRobert D Underwood, male   DOB: [birth year], [age] y.o.   MRN: [id number]    Nursing Home Location:  [facility]   Place of Service: Clinic (12)  PCP: Kimber RelicGREEN, ARTHUR G, MD  No Known Allergies  Chief Complaint  Patient presents with  . Acute Visit    cough  . Fall    on his wait to the clinic    HPI:  Patient is a [age] y.o. [facility] to weakness and shortness of breath. Pt called for appt and nursing supervisor went to [facility] because pt fell when trying to come to appt. Pt with a few days hx of shortness of breath and wheezing and cough. Nurse reports when he got to him home his O2 was low in the 70s but increased to the 90s with deep breathing. No noted fevers.   Review of Systems:  Review of Systems  Constitutional: Negative for fever, activity change, appetite change, fatigue and unexpected weight change.       Gilman SchmidtFraili elderly male.  HENT:       Bilateral hearing loss.  Respiratory: Positive for cough, shortness of breath and wheezing.   Cardiovascular: Negative for chest pain, palpitations and leg swelling.       AF  Gastrointestinal: Negative for abdominal distention.  Genitourinary: Negative.   Musculoskeletal:       Generalized weakness. No pain. Unstable gait; using cane.  Skin: Negative for pallor, rash and wound.  Neurological: Positive for tremors. Negative for seizures, syncope, speech difficulty, weakness and numbness.  Psychiatric/Behavioral: Negative.     Past Medical History  Diagnosis Date  . CHF (congestive heart failure) 01/04/2010  . Atrial fib/flutter, transient 10/10/2011  . Hypertension 12/27/2009  . Other abnormal blood chemistry 03/24/2012    Hyperglycemia  . Temporomandibular joint sounds on opening and/or closing the jaw 10/15/2011  . Hypertrophy of prostate with urinary obstruction and other lower urinary tract symptoms (LUTS) 03/19/2011  . Abnormality of gait  12/11/2010  . Nocturia 12/11/2010  . Unspecified urinary incontinence 12/11/2010  . Unspecified hypothyroidism 06/05/2010  . Chronic kidney disease, stage II (mild) 06/05/2010  . Shortness of breath 02/20/2010  . Hearing loss 01/06/2010  . Debility, unspecified 01/04/2010  . Long term (current) use of anticoagulants 01/04/2010  . Other and unspecified hyperlipidemia 12/27/2009  . Essential and other specified forms of tremor 12/27/2009  . Senile cataract, unspecified 12/27/2009  . Coronary atherosclerosis of native coronary artery 01/07/2003  . Reflux esophagitis 01/07/2003  . Diaphragmatic hernia without mention of obstruction or gangrene 01/07/2003   Past Surgical History  Procedure Laterality Date  . Cardiac surgery    . Appendectomy    . Inguinal hernia repair Right 1940  . Inguinal hernia repair Right 1990    Re-do with mesh   Social History:   reports that he has never smoked. He has never used smokeless tobacco. He reports that he drinks about 0.6 oz of alcohol per week. He reports that he does not use illicit drugs.  History reviewed. No pertinent family history.  Medications: Patient's Medications  New Prescriptions   No medications on file  Previous Medications   ACETAMINOPHEN (TYLENOL) 500 MG TABLET    Take 500 mg by mouth every 6 (six) hours as needed. For headache or pain.    CARTIA XT 120 MG 24 HR CAPSULE    TAKE ONE CAPSULE BY MOUTH DAILY TO CONTROL HEART RHYTHM   CARTIA  XT 120 MG 24 HR CAPSULE    TAKE 1 CAPSULE DAILY TO CONTROL HEART RHYTHM.   FINASTERIDE (PROSCAR) 5 MG TABLET    TAKE 1 TABLET BY MOUTH DAILY FOR PROSTATE   LEVOTHYROXINE (SYNTHROID, LEVOTHROID) 75 MCG TABLET    1 daily for thyroid supplement   MULTIPLE VITAMINS-MINERALS (PRESERVISION AREDS PO)    Take 1 tablet by mouth 2 (two) times daily.     PRADAXA 75 MG CAPS CAPSULE    TAKE ONE CAPSULE BY MOUTH EVERY MORNING AND EVERY EVENING FOR ANTICOAGULATION   SIMVASTATIN (ZOCOR) 40 MG TABLET    TAKE 1  TABLET BY MOUTH EVERY DAY  Modified Medications   No medications on file  Discontinued Medications   No medications on file     Physical Exam: Filed Vitals:   10/20/14 1635  BP: 110/58  Pulse: 68  Temp: 98.5 F (36.9 C)  TempSrc: Oral  Resp: 24  SpO2: 93%    Physical Exam  Constitutional: No distress.  frail  HENT:  Head: Normocephalic and atraumatic.  Right Ear: External ear normal.  Left Ear: External ear normal.  Nose: Nose normal.  Mouth/Throat: Oropharynx is clear and moist. No oropharyngeal exudate.  Bilateral partial deafness.   Eyes: Conjunctivae and EOM are normal. Pupils are equal, round, and reactive to light.  Bilateral lower lid ectropion.  Neck: Neck supple. No JVD present.  Cardiovascular: Normal rate and normal heart sounds.   AF Absent popliteal, DP, and PT pulses bilaterally  Pulmonary/Chest: No respiratory distress. He has no wheezes. He has rhonchi. He has rales. He exhibits no tenderness.  Increase rate   Abdominal: He exhibits no distension and no mass. There is no tenderness.  Musculoskeletal: He exhibits no edema or tenderness.  Lymphadenopathy:    He has no cervical adenopathy.  Neurological: He is alert. No cranial nerve deficit. Coordination normal.  Skin: Skin is warm and dry.  Onychomycosis lipoma of occiput Sebaceous cyst of th right cheek   Psychiatric:  Confused at this time    Labs reviewed: Basic Metabolic Panel:  Recent Labs  29/56/2112/23/14 01/28/14  NA 141 138  K 4.9 4.7  BUN 22* 26*  CREATININE 1.3 1.3   Liver Function Tests: No results for input(s): AST, ALT, ALKPHOS, BILITOT, PROT, ALBUMIN in the last 8760 hours. No results for input(s): LIPASE, AMYLASE in the last 8760 hours. No results for input(s): AMMONIA in the last 8760 hours. CBC: No results for input(s): WBC, NEUTROABS, HGB, HCT, MCV, PLT in the last 8760 hours. TSH:  Recent Labs  10/27/13 01/28/14  TSH 6.77* 4.82   A1C: Lab Results  Component Value  Date   HGBA1C 6.5* 01/28/2014   Lipid Panel:  Recent Labs  10/27/13 01/28/14  CHOL 114 106  HDL 55 47  LDLCALC 43 40  TRIG 84 71    Assessment/Plan   1. Cough pts baseline is oriented however acutely confused today on visit, pt with increased RR and abnormal breath sounds throughout. Will get chest xray at this time Will have staff admit pt to rehab (because he is unable to go back to home at this time) -duonebs q 6 for 7 days for wheezing -O2 to sats greater than 90 -mucinex DM BID for 7 days for cough and congestion -will start Augmentin 875-125 PO BID for 7 days  2. Acute confusion Most likely due to respiratory infection Being admitted to rehab  Will also get cbc and bmp at this time.

## 2014-10-21 ENCOUNTER — Encounter (HOSPITAL_COMMUNITY): Payer: Self-pay

## 2014-10-21 ENCOUNTER — Non-Acute Institutional Stay (SKILLED_NURSING_FACILITY): Payer: Medicare Other | Admitting: Adult Health

## 2014-10-21 ENCOUNTER — Encounter: Payer: Self-pay | Admitting: Adult Health

## 2014-10-21 ENCOUNTER — Emergency Department (HOSPITAL_COMMUNITY): Payer: Medicare Other

## 2014-10-21 ENCOUNTER — Inpatient Hospital Stay (HOSPITAL_COMMUNITY)
Admission: EM | Admit: 2014-10-21 | Discharge: 2014-11-05 | DRG: 871 | Disposition: E | Payer: Medicare Other | Attending: Internal Medicine | Admitting: Internal Medicine

## 2014-10-21 DIAGNOSIS — Y95 Nosocomial condition: Secondary | ICD-10-CM | POA: Diagnosis present

## 2014-10-21 DIAGNOSIS — I4892 Unspecified atrial flutter: Secondary | ICD-10-CM | POA: Diagnosis present

## 2014-10-21 DIAGNOSIS — Z515 Encounter for palliative care: Secondary | ICD-10-CM

## 2014-10-21 DIAGNOSIS — I2510 Atherosclerotic heart disease of native coronary artery without angina pectoris: Secondary | ICD-10-CM | POA: Diagnosis present

## 2014-10-21 DIAGNOSIS — H9190 Unspecified hearing loss, unspecified ear: Secondary | ICD-10-CM | POA: Diagnosis present

## 2014-10-21 DIAGNOSIS — J9601 Acute respiratory failure with hypoxia: Secondary | ICD-10-CM | POA: Diagnosis present

## 2014-10-21 DIAGNOSIS — R059 Cough, unspecified: Secondary | ICD-10-CM

## 2014-10-21 DIAGNOSIS — I5032 Chronic diastolic (congestive) heart failure: Secondary | ICD-10-CM | POA: Diagnosis present

## 2014-10-21 DIAGNOSIS — E876 Hypokalemia: Secondary | ICD-10-CM | POA: Diagnosis not present

## 2014-10-21 DIAGNOSIS — R1310 Dysphagia, unspecified: Secondary | ICD-10-CM | POA: Diagnosis present

## 2014-10-21 DIAGNOSIS — R0603 Acute respiratory distress: Secondary | ICD-10-CM

## 2014-10-21 DIAGNOSIS — A419 Sepsis, unspecified organism: Secondary | ICD-10-CM | POA: Diagnosis present

## 2014-10-21 DIAGNOSIS — R05 Cough: Secondary | ICD-10-CM | POA: Diagnosis present

## 2014-10-21 DIAGNOSIS — E872 Acidosis, unspecified: Secondary | ICD-10-CM

## 2014-10-21 DIAGNOSIS — K210 Gastro-esophageal reflux disease with esophagitis: Secondary | ICD-10-CM | POA: Diagnosis present

## 2014-10-21 DIAGNOSIS — Z66 Do not resuscitate: Secondary | ICD-10-CM | POA: Diagnosis present

## 2014-10-21 DIAGNOSIS — J189 Pneumonia, unspecified organism: Secondary | ICD-10-CM

## 2014-10-21 DIAGNOSIS — R748 Abnormal levels of other serum enzymes: Secondary | ICD-10-CM | POA: Diagnosis present

## 2014-10-21 DIAGNOSIS — N179 Acute kidney failure, unspecified: Secondary | ICD-10-CM | POA: Diagnosis present

## 2014-10-21 DIAGNOSIS — R0600 Dyspnea, unspecified: Secondary | ICD-10-CM | POA: Diagnosis present

## 2014-10-21 DIAGNOSIS — I129 Hypertensive chronic kidney disease with stage 1 through stage 4 chronic kidney disease, or unspecified chronic kidney disease: Secondary | ICD-10-CM | POA: Diagnosis present

## 2014-10-21 DIAGNOSIS — N183 Chronic kidney disease, stage 3 unspecified: Secondary | ICD-10-CM | POA: Diagnosis present

## 2014-10-21 DIAGNOSIS — R0689 Other abnormalities of breathing: Secondary | ICD-10-CM

## 2014-10-21 DIAGNOSIS — N182 Chronic kidney disease, stage 2 (mild): Secondary | ICD-10-CM

## 2014-10-21 DIAGNOSIS — R6520 Severe sepsis without septic shock: Secondary | ICD-10-CM | POA: Diagnosis present

## 2014-10-21 DIAGNOSIS — I4891 Unspecified atrial fibrillation: Secondary | ICD-10-CM | POA: Diagnosis present

## 2014-10-21 DIAGNOSIS — W19XXXA Unspecified fall, initial encounter: Secondary | ICD-10-CM | POA: Diagnosis present

## 2014-10-21 DIAGNOSIS — Z8701 Personal history of pneumonia (recurrent): Secondary | ICD-10-CM | POA: Diagnosis not present

## 2014-10-21 DIAGNOSIS — I10 Essential (primary) hypertension: Secondary | ICD-10-CM

## 2014-10-21 DIAGNOSIS — Z79899 Other long term (current) drug therapy: Secondary | ICD-10-CM | POA: Diagnosis not present

## 2014-10-21 DIAGNOSIS — B974 Respiratory syncytial virus as the cause of diseases classified elsewhere: Secondary | ICD-10-CM | POA: Diagnosis present

## 2014-10-21 DIAGNOSIS — E785 Hyperlipidemia, unspecified: Secondary | ICD-10-CM | POA: Diagnosis present

## 2014-10-21 DIAGNOSIS — E039 Hypothyroidism, unspecified: Secondary | ICD-10-CM | POA: Diagnosis present

## 2014-10-21 DIAGNOSIS — Z7901 Long term (current) use of anticoagulants: Secondary | ICD-10-CM | POA: Diagnosis not present

## 2014-10-21 DIAGNOSIS — I5030 Unspecified diastolic (congestive) heart failure: Secondary | ICD-10-CM

## 2014-10-21 DIAGNOSIS — R778 Other specified abnormalities of plasma proteins: Secondary | ICD-10-CM

## 2014-10-21 DIAGNOSIS — R7989 Other specified abnormal findings of blood chemistry: Secondary | ICD-10-CM

## 2014-10-21 DIAGNOSIS — R0602 Shortness of breath: Secondary | ICD-10-CM

## 2014-10-21 DIAGNOSIS — H259 Unspecified age-related cataract: Secondary | ICD-10-CM | POA: Diagnosis present

## 2014-10-21 LAB — URINE MICROSCOPIC-ADD ON

## 2014-10-21 LAB — CBC WITH DIFFERENTIAL/PLATELET
Basophils Absolute: 0 10*3/uL (ref 0.0–0.1)
Basophils Relative: 0 % (ref 0–1)
Eosinophils Absolute: 0 10*3/uL (ref 0.0–0.7)
Eosinophils Relative: 0 % (ref 0–5)
HEMATOCRIT: 35.4 % — AB (ref 39.0–52.0)
HEMOGLOBIN: 11.4 g/dL — AB (ref 13.0–17.0)
Lymphocytes Relative: 3 % — ABNORMAL LOW (ref 12–46)
Lymphs Abs: 0.4 10*3/uL — ABNORMAL LOW (ref 0.7–4.0)
MCH: 29.3 pg (ref 26.0–34.0)
MCHC: 32.2 g/dL (ref 30.0–36.0)
MCV: 91 fL (ref 78.0–100.0)
MONOS PCT: 5 % (ref 3–12)
Monocytes Absolute: 0.5 10*3/uL (ref 0.1–1.0)
NEUTROS ABS: 10.8 10*3/uL — AB (ref 1.7–7.7)
Neutrophils Relative %: 92 % — ABNORMAL HIGH (ref 43–77)
Platelets: 149 10*3/uL — ABNORMAL LOW (ref 150–400)
RBC: 3.89 MIL/uL — ABNORMAL LOW (ref 4.22–5.81)
RDW: 14.7 % (ref 11.5–15.5)
WBC: 11.7 10*3/uL — ABNORMAL HIGH (ref 4.0–10.5)

## 2014-10-21 LAB — I-STAT TROPONIN, ED: Troponin i, poc: 0.19 ng/mL (ref 0.00–0.08)

## 2014-10-21 LAB — COMPREHENSIVE METABOLIC PANEL
ALBUMIN: 2.9 g/dL — AB (ref 3.5–5.2)
ALK PHOS: 84 U/L (ref 39–117)
ALT: 22 U/L (ref 0–53)
ANION GAP: 17 — AB (ref 5–15)
AST: 47 U/L — AB (ref 0–37)
BUN: 30 mg/dL — AB (ref 6–23)
CHLORIDE: 97 meq/L (ref 96–112)
CO2: 19 mEq/L (ref 19–32)
Calcium: 8.2 mg/dL — ABNORMAL LOW (ref 8.4–10.5)
Creatinine, Ser: 1.47 mg/dL — ABNORMAL HIGH (ref 0.50–1.35)
GFR calc Af Amer: 42 mL/min — ABNORMAL LOW (ref >90)
GFR calc non Af Amer: 37 mL/min — ABNORMAL LOW (ref >90)
Glucose, Bld: 201 mg/dL — ABNORMAL HIGH (ref 70–99)
POTASSIUM: 4.3 meq/L (ref 3.7–5.3)
SODIUM: 133 meq/L — AB (ref 137–147)
Total Bilirubin: 0.8 mg/dL (ref 0.3–1.2)
Total Protein: 6.7 g/dL (ref 6.0–8.3)

## 2014-10-21 LAB — URINALYSIS, ROUTINE W REFLEX MICROSCOPIC
Bilirubin Urine: NEGATIVE
GLUCOSE, UA: NEGATIVE mg/dL
Ketones, ur: NEGATIVE mg/dL
LEUKOCYTES UA: NEGATIVE
Nitrite: NEGATIVE
Protein, ur: NEGATIVE mg/dL
SPECIFIC GRAVITY, URINE: 1.018 (ref 1.005–1.030)
Urobilinogen, UA: 0.2 mg/dL (ref 0.0–1.0)
pH: 5 (ref 5.0–8.0)

## 2014-10-21 LAB — I-STAT CG4 LACTIC ACID, ED
Lactic Acid, Venous: 4.9 mmol/L — ABNORMAL HIGH (ref 0.5–2.2)
Lactic Acid, Venous: 3.87 mmol/L — ABNORMAL HIGH (ref 0.5–2.2)

## 2014-10-21 LAB — I-STAT VENOUS BLOOD GAS, ED
ACID-BASE DEFICIT: 6 mmol/L — AB (ref 0.0–2.0)
BICARBONATE: 19.3 meq/L — AB (ref 20.0–24.0)
O2 SAT: 97 %
PO2 VEN: 97 mmHg — AB (ref 30.0–45.0)
TCO2: 20 mmol/L (ref 0–100)
pCO2, Ven: 35.2 mmHg — ABNORMAL LOW (ref 45.0–50.0)
pH, Ven: 7.347 — ABNORMAL HIGH (ref 7.250–7.300)

## 2014-10-21 LAB — PRO B NATRIURETIC PEPTIDE: Pro B Natriuretic peptide (BNP): 8315 pg/mL — ABNORMAL HIGH (ref 0–450)

## 2014-10-21 LAB — LACTIC ACID, PLASMA: Lactic Acid, Venous: 4.8 mmol/L — ABNORMAL HIGH (ref 0.5–2.2)

## 2014-10-21 MED ORDER — ACETAMINOPHEN 650 MG RE SUPP
650.0000 mg | Freq: Four times a day (QID) | RECTAL | Status: DC | PRN
Start: 2014-10-21 — End: 2014-10-28

## 2014-10-21 MED ORDER — SODIUM CHLORIDE 0.9 % IV SOLN: 250.0000 mL | INTRAVENOUS | Status: DC | PRN

## 2014-10-21 MED ORDER — VANCOMYCIN HCL IN DEXTROSE 750-5 MG/150ML-% IV SOLN
750.0000 mg | INTRAVENOUS | Status: DC
  Administered 2014-10-22 – 2014-10-25 (×4): 750 mg via INTRAVENOUS
  Filled 2014-10-21 (×5): qty 150

## 2014-10-21 MED ORDER — SODIUM CHLORIDE 0.9 % IV BOLUS (SEPSIS)
1000.0000 mL | INTRAVENOUS | Status: AC
  Administered 2014-10-21: 1000 mL via INTRAVENOUS

## 2014-10-21 MED ORDER — DEXTROSE 5 % IV SOLN
1.0000 g | INTRAVENOUS | Status: DC
  Administered 2014-10-22 – 2014-10-26 (×5): 1 g via INTRAVENOUS
  Filled 2014-10-21 (×5): qty 1

## 2014-10-21 MED ORDER — SODIUM CHLORIDE 0.9 % IJ SOLN
3.0000 mL | Freq: Two times a day (BID) | INTRAMUSCULAR | Status: DC
  Administered 2014-10-21 – 2014-10-27 (×5): 3 mL via INTRAVENOUS

## 2014-10-21 MED ORDER — ACETAMINOPHEN 650 MG RE SUPP
650.0000 mg | Freq: Once | RECTAL | Status: AC
  Administered 2014-10-21: 650 mg via RECTAL
  Filled 2014-10-21: qty 1

## 2014-10-21 MED ORDER — HEPARIN SODIUM (PORCINE) 5000 UNIT/ML IJ SOLN
5000.0000 [IU] | Freq: Three times a day (TID) | INTRAMUSCULAR | Status: DC
  Administered 2014-10-21 – 2014-10-22 (×2): 5000 [IU] via SUBCUTANEOUS
  Filled 2014-10-21 (×3): qty 1

## 2014-10-21 MED ORDER — VANCOMYCIN HCL IN DEXTROSE 1-5 GM/200ML-% IV SOLN
1000.0000 mg | Freq: Once | INTRAVENOUS | Status: AC
  Administered 2014-10-21: 1000 mg via INTRAVENOUS
  Filled 2014-10-21: qty 200

## 2014-10-21 MED ORDER — ACETAMINOPHEN 325 MG PO TABS: 650.0000 mg | ORAL_TABLET | Freq: Four times a day (QID) | ORAL | Status: DC | PRN

## 2014-10-21 MED ORDER — SODIUM CHLORIDE 0.9 % IJ SOLN
3.0000 mL | Freq: Two times a day (BID) | INTRAMUSCULAR | Status: DC
  Administered 2014-10-21 – 2014-10-27 (×8): 3 mL via INTRAVENOUS

## 2014-10-21 MED ORDER — BIOTENE DRY MOUTH MT LIQD
15.0000 mL | OROMUCOSAL | Status: DC
  Administered 2014-10-22 – 2014-10-27 (×34): 15 mL via OROMUCOSAL

## 2014-10-21 MED ORDER — IPRATROPIUM-ALBUTEROL 0.5-2.5 (3) MG/3ML IN SOLN: 3.0000 mL | Freq: Four times a day (QID) | RESPIRATORY_TRACT | Status: DC | PRN

## 2014-10-21 MED ORDER — SODIUM CHLORIDE 0.9 % IJ SOLN: 3.0000 mL | INTRAMUSCULAR | Status: DC | PRN

## 2014-10-21 MED ORDER — DEXTROSE 5 % IV SOLN
2.0000 g | Freq: Once | INTRAVENOUS | Status: AC
  Administered 2014-10-21: 2 g via INTRAVENOUS
  Filled 2014-10-21: qty 2

## 2014-10-21 NOTE — Progress Notes (Signed)
ANTIBIOTIC CONSULT NOTE - INITIAL  Pharmacy Consult for cefepime and vancomycin Indication: pneumonia  No Known Allergies  Patient Measurements:   Adjusted Body Weight: estimated ~135-140 lbs  Vital Signs: Temp: 102.9 F (39.4 C) (12/17 1803) Temp Source: Rectal (12/17 1803) BP: 122/72 mmHg (12/17 1852) Pulse Rate: 105 (12/17 1852) Intake/Output from previous day:   Intake/Output from this shift:    Labs:  Recent Labs  2014/06/03 1739  WBC 11.7*  HGB 11.4*  PLT 149*  CREATININE 1.47*   CrCl cannot be calculated (Unknown ideal weight.). No results for input(s): VANCOTROUGH, VANCOPEAK, VANCORANDOM, GENTTROUGH, GENTPEAK, GENTRANDOM, TOBRATROUGH, TOBRAPEAK, TOBRARND, AMIKACINPEAK, AMIKACINTROU, AMIKACIN in the last 72 hours.   Microbiology: No results found for this or any previous visit (from the past 720 hour(s)).  Medical History: Past Medical History  Diagnosis Date  . CHF (congestive heart failure) 01/04/2010  . Atrial fib/flutter, transient 10/10/2011  . Hypertension 12/27/2009  . Other abnormal blood chemistry 03/24/2012    Hyperglycemia  . Temporomandibular joint sounds on opening and/or closing the jaw 10/15/2011  . Hypertrophy of prostate with urinary obstruction and other lower urinary tract symptoms (LUTS) 03/19/2011  . Abnormality of gait 12/11/2010  . Nocturia 12/11/2010  . Unspecified urinary incontinence 12/11/2010  . Unspecified hypothyroidism 06/05/2010  . Chronic kidney disease, stage II (mild) 06/05/2010  . Shortness of breath 02/20/2010  . Hearing loss 01/06/2010  . Debility, unspecified 01/04/2010  . Long term (current) use of anticoagulants 01/04/2010  . Other and unspecified hyperlipidemia 12/27/2009  . Essential and other specified forms of tremor 12/27/2009  . Senile cataract, unspecified 12/27/2009  . Coronary atherosclerosis of native coronary artery 01/07/2003  . Reflux esophagitis 01/07/2003  . Diaphragmatic hernia without mention  of obstruction or gangrene 01/07/2003    Medications:  Augmentin, diltiazem, proscar, lasix, duobebs, synthroid, Pradaxa 75mg  BID, simvastatin Assessment: 80 year old man brought in by EMS for shortness of breath.  Cefepime and vancomycin to start for pneumonia.  Creatinine is elevated to 1.47.  Goal of Therapy:  Vancomycin trough level 15-20 mcg/ml  Plan:  Measure antibiotic drug levels at steady state Follow up culture results Vancomycin 1g iv x 1 dose, then 750 mg iv daily  Cefepime 2g iv x 1 dose, then 1g daily Monitor renal function  Mickeal SkinnerFrens, Dawnn Nam John Jul 10, 2014,6:54 PM

## 2014-10-21 NOTE — ED Provider Notes (Signed)
CSN: [id number]     Arrival date & time 01-05-2014  1726 History   First MD Initiated Contact with Patient 01-05-2014 1732     Chief Complaint  Patient presents with  . Shortness of Breath     (Consider location/radiation/quality/duration/timing/severity/associated sxs/prior Treatment) Patient is a 80 y.o. male presenting with shortness of breath.  Shortness of Breath Severity:  Severe Onset quality:  Gradual Duration:  2 days Timing:  Constant Progression:  Worsening Chronicity:  New Context: URI   Relieved by:  Nothing Worsened by:  Nothing tried Ineffective treatments:  Inhaler and diuretics Associated symptoms: cough and fever   Associated symptoms: no abdominal pain, no chest pain, no headaches, no syncope, no vomiting and no wheezing   Risk factors: no hx of PE/DVT     Past Medical History  Diagnosis Date  . CHF (congestive heart failure) 01/04/2010  . Atrial fib/flutter, transient 10/10/2011  . Hypertension 12/27/2009  . Other abnormal blood chemistry 03/24/2012    Hyperglycemia  . Temporomandibular joint sounds on opening and/or closing the jaw 10/15/2011  . Hypertrophy of prostate with urinary obstruction and other lower urinary tract symptoms (LUTS) 03/19/2011  . Abnormality of gait 12/11/2010  . Nocturia 12/11/2010  . Unspecified urinary incontinence 12/11/2010  . Unspecified hypothyroidism 06/05/2010  . Chronic kidney disease, stage II (mild) 06/05/2010  . Shortness of breath 02/20/2010  . Hearing loss 01/06/2010  . Debility, unspecified 01/04/2010  . Long term (current) use of anticoagulants 01/04/2010  . Other and unspecified hyperlipidemia 12/27/2009  . Essential and other specified forms of tremor 12/27/2009  . Senile cataract, unspecified 12/27/2009  . Coronary atherosclerosis of native coronary artery 01/07/2003  . Reflux esophagitis 01/07/2003  . Diaphragmatic hernia without mention of obstruction or gangrene 01/07/2003   Past Surgical History   Procedure Laterality Date  . Cardiac surgery    . Appendectomy    . Inguinal hernia repair Right 1940  . Inguinal hernia repair Right 1990    Re-do with mesh   No family history on file. History  Substance Use Topics  . Smoking status: Never Smoker   . Smokeless tobacco: Never Used  . Alcohol Use: 0.6 oz/week    1 Shots of liquor per week     Comment: scotch and water once a day     Review of Systems  Unable to perform ROS: Acuity of condition  Constitutional: Positive for fever, activity change and fatigue.  Respiratory: Positive for cough and shortness of breath. Negative for wheezing.   Cardiovascular: Negative for chest pain and syncope.  Gastrointestinal: Negative for vomiting and abdominal pain.  Neurological: Negative for syncope and headaches.      Allergies  Review of patient's allergies indicates no known allergies.  Home Medications   Prior to Admission medications   Medication Sig Start Date End Date Taking? Authorizing Provider  amoxicillin-clavulanate (AUGMENTIN) 875-125 MG per tablet Take 1 tablet by mouth 2 (two) times daily.   Yes Historical Provider, MD  CARTIA XT 120 MG 24 hr capsule TAKE 1 CAPSULE DAILY TO CONTROL HEART RHYTHM. 08/30/14  Yes Kimber RelicArthur G Green, MD  finasteride (PROSCAR) 5 MG tablet TAKE 1 TABLET BY MOUTH DAILY FOR PROSTATE 07/19/14  Yes Kimber RelicArthur G Green, MD  furosemide (LASIX) 20 MG tablet Take 20 mg by mouth daily.   Yes Historical Provider, MD  ipratropium-albuterol (DUONEB) 0.5-2.5 (3) MG/3ML SOLN Take 3 mLs by nebulization every 6 (six) hours as needed (Shortness of breath).   Yes Historical Provider, MD  Multiple Vitamins-Minerals (MULTIVITAMIN WITH MINERALS) tablet Take 1 tablet by mouth 2 (two) times daily.   Yes Historical Provider, MD  PRADAXA 75 MG CAPS capsule TAKE ONE CAPSULE BY MOUTH EVERY MORNING AND EVERY EVENING FOR ANTICOAGULATION 09/17/14  Yes Kimber Relic, MD  saccharomyces boulardii (FLORASTOR) 250 MG capsule Take 250 mg  by mouth 2 (two) times daily.   Yes Historical Provider, MD  simvastatin (ZOCOR) 40 MG tablet TAKE 1 TABLET BY MOUTH EVERY DAY 05/27/14  Yes Kimber Relic, MD  acetaminophen (TYLENOL) 500 MG tablet Take 500 mg by mouth every 6 (six) hours as needed for mild pain or headache.     Historical Provider, MD  levothyroxine (SYNTHROID, LEVOTHROID) 75 MCG tablet 1 daily for thyroid supplement Patient taking differently: Take 75 mcg by mouth daily before breakfast. 1 daily for thyroid supplement 05/11/13   Kimber Relic, MD   BP 122/72 mmHg  Pulse 105  Temp(Src) 102.9 F (39.4 C) (Rectal)  Resp 32  SpO2 100% Physical Exam  Constitutional: He appears well-developed and well-nourished. No distress.  HENT:  Head: Normocephalic and atraumatic.  Eyes: Conjunctivae and EOM are normal.  Neck: Normal range of motion.  Cardiovascular: Normal heart sounds and intact distal pulses.  An irregularly irregular rhythm present. Tachycardia present.  Exam reveals no gallop and no friction rub.   No murmur heard. Pulmonary/Chest: Accessory muscle usage present. Tachypnea noted. He is in respiratory distress. He has no wheezes. He has rhonchi. He has rales.  Abdominal: Soft. He exhibits no distension. There is no tenderness. There is no guarding.  Musculoskeletal: He exhibits no edema.  Neurological: He is alert.  Skin: Skin is warm and dry. He is not diaphoretic.  Nursing note and vitals reviewed.   ED Course  Procedures (including critical care time) Labs Review Labs Reviewed  CBC WITH DIFFERENTIAL - Abnormal; Notable for the following:    WBC 11.7 (*)    RBC 3.89 (*)    Hemoglobin 11.4 (*)    HCT 35.4 (*)    Platelets 149 (*)    Neutrophils Relative % 92 (*)    Neutro Abs 10.8 (*)    Lymphocytes Relative 3 (*)    Lymphs Abs 0.4 (*)    All other components within normal limits  PRO B NATRIURETIC PEPTIDE - Abnormal; Notable for the following:    Pro B Natriuretic peptide (BNP) 8315.0 (*)    All  other components within normal limits  COMPREHENSIVE METABOLIC PANEL - Abnormal; Notable for the following:    Sodium 133 (*)    Glucose, Bld 201 (*)    BUN 30 (*)    Creatinine, Ser 1.47 (*)    Calcium 8.2 (*)    Albumin 2.9 (*)    AST 47 (*)    GFR calc non Af Amer 37 (*)    GFR calc Af Amer 42 (*)    Anion gap 17 (*)    All other components within normal limits  LACTIC ACID, PLASMA - Abnormal; Notable for the following:    Lactic Acid, Venous 4.8 (*)    All other components within normal limits  I-STAT TROPOININ, ED - Abnormal; Notable for the following:    Troponin i, poc 0.19 (*)    All other components within normal limits  I-STAT CG4 LACTIC ACID, ED - Abnormal; Notable for the following:    Lactic Acid, Venous 4.90 (*)    All other components within normal limits  I-STAT VENOUS BLOOD  GAS, ED - Abnormal; Notable for the following:    pH, Ven 7.347 (*)    pCO2, Ven 35.2 (*)    pO2, Ven 97.0 (*)    Bicarbonate 19.3 (*)    Acid-base deficit 6.0 (*)    All other components within normal limits  CULTURE, BLOOD (ROUTINE X 2)  CULTURE, BLOOD (ROUTINE X 2)  URINE CULTURE  BLOOD GAS, VENOUS  URINALYSIS, ROUTINE W REFLEX MICROSCOPIC    Imaging Review Dg Chest Port 1 View  2014/07/21   CLINICAL DATA:  Shortness of breath.  EXAM: PORTABLE CHEST - 1 VIEW  COMPARISON:  Mar 07, 2012.  FINDINGS: Stable cardiomediastinal silhouette. No pneumothorax or pleural effusion is noted. Left lung is clear. Mild right upper lobe opacity is noted concerning for focal atelectasis or possibly pneumonia. Bony thorax appears intact.  IMPRESSION: Faint right upper lobe opacity is noted concerning for subsegmental atelectasis or possibly pneumonia. Followup radiographs are recommended to ensure resolution.   Electronically Signed   By: Roque LiasJames  Green M.D.   On: 2014/07/21 18:19     EKG Interpretation   Date/Time:  Thursday October 21 2014 17:36:10 EST Ventricular Rate:  129 PR Interval:  87 QRS  Duration: 84 QT Interval:  326 QTC Calculation: 478 R Axis:   2 Text Interpretation:  Sinus tachycardia Low voltage, precordial leads  Borderline prolonged QT interval Confirmed by RAY MD, Duwayne HeckANIELLE [phone]([id number]) on  2014/07/21 5:41:35 PM      MDM   Final diagnoses:  None  Severe sepsis secondary to HCAP Lactic acidosis AKI   80 year old patient with history of CHF A. fib hypertension presents from a nursing facility for concern of 2 days of cough and shortness of breath. Family reports he had generalized weakness beginning yesterday and due to this had a fall without head impact or loss of consciousness.  Patient's oxygenation and he was arrival was 85% and he is placed on BiPAP. I will emergency department he was to Noted to have saturation of 85% on room air BiPAP for both tachypnea and hypoxia with improvement in his respiratory rate and oxygenation.While his blood pressure was initially normal his blood pressure dropped to us 78 over 50s and he is given a bolus of normal saline with improvement of blood pressures to normotension. A code sepsis was initiated and he was given vancomycin and cefepime for sepsis likely secondary to a healthcare associated pneumonia. Chest x-ray showed findings concerning for a right-sided pneumonia. His lactic acid was 4.8 and he had a mild acute kidney injury with a creatinine 1.47.  His BNP was elevated at 8315 with a troponin of 0.19, however feel these elevations are likely secondary to acute sepsis in the setting of pneumonia given clinical history of increasing cough and fever to 102.  Discuss CODE STATUS with in detail with patient and his family, however they've not made a decision at this time.  He will be admitted to the stepdown unit for further\ treatment and further discussions of goals of care.   Rhae LernerErin Elizabeth Rehema Muffley, MD 10/22/14 [id number]  Rhae LernerErin Elizabeth Donyel Castagnola, MD 10/22/14 [id number]  Hilario Quarryanielle S Ray, MD 10/23/14 [phone]

## 2014-10-21 NOTE — ED Provider Notes (Signed)
80 y.o. Male from [facility] with report of "sick" for two days.  Report fell two days  Ago, cough for several days with generalized weakness yesterday and fell while standing with walker.  X-Dung Prien of chest reported normal  Yesterday.  Worse today with increased dyspnea.  Patient transported via ems with sats mid 80s and patient placed on bipap with sats increased to mid 90s and continues very tachypneic.  Elderly male in moderate distress,bipap in place Tachycardiac tachypneic Skin warm to touch Lungs with bilateral rhonchi and rales  Results for orders placed or performed during the hospital encounter of 10-08-14  CBC with Differential  Result Value Ref Range   WBC 11.7 (H) 4.0 - 10.5 K/uL   RBC 3.89 (L) 4.22 - 5.81 MIL/uL   Hemoglobin 11.4 (L) 13.0 - 17.0 g/dL   HCT 16.135.4 (L) 09.639.0 - 04.552.0 %   MCV 91.0 78.0 - 100.0 fL   MCH 29.3 26.0 - 34.0 pg   MCHC 32.2 30.0 - 36.0 g/dL   RDW 40.914.7 81.111.5 - 91.415.5 %   Platelets 149 (L) 150 - 400 K/uL   Neutrophils Relative % 92 (H) 43 - 77 %   Neutro Abs 10.8 (H) 1.7 - 7.7 K/uL   Lymphocytes Relative 3 (L) 12 - 46 %   Lymphs Abs 0.4 (L) 0.7 - 4.0 K/uL   Monocytes Relative 5 3 - 12 %   Monocytes Absolute 0.5 0.1 - 1.0 K/uL   Eosinophils Relative 0 0 - 5 %   Eosinophils Absolute 0.0 0.0 - 0.7 K/uL   Basophils Relative 0 0 - 1 %   Basophils Absolute 0.0 0.0 - 0.1 K/uL  Pro b natriuretic peptide (BNP)  Result Value Ref Range   Pro B Natriuretic peptide (BNP) 8315.0 (H) 0 - 450 pg/mL  Comprehensive metabolic panel  Result Value Ref Range   Sodium 133 (L) 137 - 147 mEq/L   Potassium 4.3 3.7 - 5.3 mEq/L   Chloride 97 96 - 112 mEq/L   CO2 19 19 - 32 mEq/L   Glucose, Bld 201 (H) 70 - 99 mg/dL   BUN 30 (H) 6 - 23 mg/dL   Creatinine, Ser 7.821.47 (H) 0.50 - 1.35 mg/dL   Calcium 8.2 (L) 8.4 - 10.5 mg/dL   Total Protein 6.7 6.0 - 8.3 g/dL   Albumin 2.9 (L) 3.5 - 5.2 g/dL   AST 47 (H) 0 - 37 U/L   ALT 22 0 - 53 U/L   Alkaline Phosphatase 84 39 - 117 U/L   Total  Bilirubin 0.8 0.3 - 1.2 mg/dL   GFR calc non Af Amer 37 (L) >90 mL/min   GFR calc Af Amer 42 (L) >90 mL/min   Anion gap 17 (H) 5 - 15  Lactic acid, plasma  Result Value Ref Range   Lactic Acid, Venous 4.8 (H) 0.5 - 2.2 mmol/L  I-stat troponin, ED  Result Value Ref Range   Troponin i, poc 0.19 (HH) 0.00 - 0.08 ng/mL   Comment NOTIFIED PHYSICIAN    Comment 3          I-Stat CG4 Lactic Acid, ED  Result Value Ref Range   Lactic Acid, Venous 4.90 (H) 0.5 - 2.2 mmol/L  I-Stat venous blood gas, ED  Result Value Ref Range   pH, Ven 7.347 (H) 7.250 - 7.300   pCO2, Ven 35.2 (L) 45.0 - 50.0 mmHg   pO2, Ven 97.0 (H) 30.0 - 45.0 mmHg   Bicarbonate 19.3 (L) 20.0 -  24.0 mEq/L   TCO2 20 0 - 100 mmol/L   O2 Saturation 97.0 %   Acid-base deficit 6.0 (H) 0.0 - 2.0 mmol/L   Collection site BRACHIAL ARTERY    Sample type VENOUS    Dg Chest Port 1 View  10/20/2014   CLINICAL DATA:  Shortness of breath.  EXAM: PORTABLE CHEST - 1 VIEW  COMPARISON:  Mar 07, 2012.  FINDINGS: Stable cardiomediastinal silhouette. No pneumothorax or pleural effusion is noted. Left lung is clear. Mild right upper lobe opacity is noted concerning for focal atelectasis or possibly pneumonia. Bony thorax appears intact.  IMPRESSION: Faint right upper lobe opacity is noted concerning for subsegmental atelectasis or possibly pneumonia. Followup radiographs are recommended to ensure resolution.   Electronically Signed   By: Roque LiasJames  Green M.D.   On: 10/20/2014 18:19    Extensive conversations with family regarding care.  Patient currently being treated with iv fluids and antibiotics.  BP low normal.  Tachycardia improving with fluids and temperature control.  I saw and evaluated the patient, reviewed the resident's note and I agree with the findings and plan.   EKG Interpretation   Date/Time:  Thursday October 21 2014 17:36:10 EST Ventricular Rate:  129 PR Interval:  87 QRS Duration: 84 QT Interval:  326 QTC Calculation:  478 R Axis:   2 Text Interpretation:  Sinus tachycardia Low voltage, precordial leads  Borderline prolonged QT interval Confirmed by Sulay Brymer MD, Duwayne HeckANIELLE [phone]([id number]) on  10/20/2014 5:41:35 PM         Hilario Quarryanielle S Cashe Gatt, MD 10/26/2014 [id number]

## 2014-10-21 NOTE — ED Notes (Signed)
Per [facility]: Pt. Is from [facility]. Pt. Larey SeatFell two days ago. Pt. Started feeling SOB yesterday, received a breathing treatment with no improvement. Mid 80's SPO2 on EMS arrival. Pt. On CPAP with sats in low 90's. A/O x4.

## 2014-10-21 NOTE — H&P (Signed)
Larry Underwood is an 80 y.o. male.   Chief Complaint: respiratory distress HPI: Larry Underwood is a 80 yo man with h/o chronic diastolic CHF, AF, HTN, CKD stage II and prior PNA who presents from SNF with respiratory distress.  History is limited as patient has increased WOB, is tachypneic and unable to answer questions.  His son was on his way out and was able to say that he saw him today at 5 pm from which time he had a significant decline in his respiratory status.  Upon arrival to ED, he was febrile to 102.9 and noted to have a leukocytosis with CXR concerning for PNA.  He was also mildly hypotensive and tachycardic meeting sepsis criteria.  He was started on BiPAP for WOB, initiated on Abx, given 1L NS with improvement in BP and [facility] called for admission.  Initial conversation regarding code status initiated in ED, however, decision not made at that time.  Son Larry Underwood) stated that his father said he would "want to live to 28" and that if it was a potentially reversible cause would want aggressive measures done including mechanical ventilation.    Past Medical History  Diagnosis Date  . CHF (congestive heart failure) 01/04/2010  . Atrial fib/flutter, transient 10/10/2011  . Hypertension 12/27/2009  . Other abnormal blood chemistry 03/24/2012    Hyperglycemia  . Temporomandibular joint sounds on opening and/or closing the jaw 10/15/2011  . Hypertrophy of prostate with urinary obstruction and other lower urinary tract symptoms (LUTS) 03/19/2011  . Abnormality of gait 12/11/2010  . Nocturia 12/11/2010  . Unspecified urinary incontinence 12/11/2010  . Unspecified hypothyroidism 06/05/2010  . Chronic kidney disease, stage II (mild) 06/05/2010  . Shortness of breath 02/20/2010  . Hearing loss 01/06/2010  . Debility, unspecified 01/04/2010  . Long term (current) use of anticoagulants 01/04/2010  . Other and unspecified hyperlipidemia 12/27/2009  . Essential and other specified forms of  tremor 12/27/2009  . Senile cataract, unspecified 12/27/2009  . Coronary atherosclerosis of native coronary artery 01/07/2003  . Reflux esophagitis 01/07/2003  . Diaphragmatic hernia without mention of obstruction or gangrene 01/07/2003    Past Surgical History  Procedure Laterality Date  . Cardiac surgery    . Appendectomy    . Inguinal hernia repair Right 1940  . Inguinal hernia repair Right 1990    Re-do with mesh    No family history on file. Social History:  reports that he has never smoked. He has never used smokeless tobacco. He reports that he drinks about 0.6 oz of alcohol per week. He reports that he does not use illicit drugs.  Allergies: No Known Allergies  Medications Prior to Admission  Medication Sig Dispense Refill  . amoxicillin-clavulanate (AUGMENTIN) 875-125 MG per tablet Take 1 tablet by mouth 2 (two) times daily.    Marland Kitchen CARTIA XT 120 MG 24 hr capsule TAKE 1 CAPSULE DAILY TO CONTROL HEART RHYTHM. 30 capsule 5  . finasteride (PROSCAR) 5 MG tablet TAKE 1 TABLET BY MOUTH DAILY FOR PROSTATE 30 tablet 5  . furosemide (LASIX) 20 MG tablet Take 20 mg by mouth daily.    Marland Kitchen ipratropium-albuterol (DUONEB) 0.5-2.5 (3) MG/3ML SOLN Take 3 mLs by nebulization every 6 (six) hours as needed (Shortness of breath).    . Multiple Vitamins-Minerals (MULTIVITAMIN WITH MINERALS) tablet Take 1 tablet by mouth 2 (two) times daily.    Marland Kitchen PRADAXA 75 MG CAPS capsule TAKE ONE CAPSULE BY MOUTH EVERY MORNING AND EVERY EVENING FOR ANTICOAGULATION 60 capsule  0  . saccharomyces boulardii (FLORASTOR) 250 MG capsule Take 250 mg by mouth 2 (two) times daily.    . simvastatin (ZOCOR) 40 MG tablet TAKE 1 TABLET BY MOUTH EVERY DAY 30 tablet 5  . acetaminophen (TYLENOL) 500 MG tablet Take 500 mg by mouth every 6 (six) hours as needed for mild pain or headache.     . levothyroxine (SYNTHROID, LEVOTHROID) 75 MCG tablet 1 daily for thyroid supplement (Patient taking differently: Take 75 mcg by mouth daily  before breakfast. 1 daily for thyroid supplement) 90 tablet 3    Results for orders placed or performed during the hospital encounter of 10/15/2014 (from the past 48 hour(s))  CBC with Differential     Status: Abnormal   Collection Time: 11/04/2014  5:39 PM  Result Value Ref Range   WBC 11.7 (H) 4.0 - 10.5 K/uL   RBC 3.89 (L) 4.22 - 5.81 MIL/uL   Hemoglobin 11.4 (L) 13.0 - 17.0 g/dL   HCT 35.4 (L) 39.0 - 52.0 %   MCV 91.0 78.0 - 100.0 fL   MCH 29.3 26.0 - 34.0 pg   MCHC 32.2 30.0 - 36.0 g/dL   RDW 14.7 11.5 - 15.5 %   Platelets 149 (L) 150 - 400 K/uL   Neutrophils Relative % 92 (H) 43 - 77 %   Neutro Abs 10.8 (H) 1.7 - 7.7 K/uL   Lymphocytes Relative 3 (L) 12 - 46 %   Lymphs Abs 0.4 (L) 0.7 - 4.0 K/uL   Monocytes Relative 5 3 - 12 %   Monocytes Absolute 0.5 0.1 - 1.0 K/uL   Eosinophils Relative 0 0 - 5 %   Eosinophils Absolute 0.0 0.0 - 0.7 K/uL   Basophils Relative 0 0 - 1 %   Basophils Absolute 0.0 0.0 - 0.1 K/uL  Pro b natriuretic peptide (BNP)     Status: Abnormal   Collection Time: 10/31/2014  5:39 PM  Result Value Ref Range   Pro B Natriuretic peptide (BNP) 8315.0 (H) 0 - 450 pg/mL  Comprehensive metabolic panel     Status: Abnormal   Collection Time: 10/05/2014  5:39 PM  Result Value Ref Range   Sodium 133 (L) 137 - 147 mEq/L   Potassium 4.3 3.7 - 5.3 mEq/L   Chloride 97 96 - 112 mEq/L   CO2 19 19 - 32 mEq/L   Glucose, Bld 201 (H) 70 - 99 mg/dL   BUN 30 (H) 6 - 23 mg/dL   Creatinine, Ser 1.47 (H) 0.50 - 1.35 mg/dL   Calcium 8.2 (L) 8.4 - 10.5 mg/dL   Total Protein 6.7 6.0 - 8.3 g/dL   Albumin 2.9 (L) 3.5 - 5.2 g/dL   AST 47 (H) 0 - 37 U/L   ALT 22 0 - 53 U/L   Alkaline Phosphatase 84 39 - 117 U/L   Total Bilirubin 0.8 0.3 - 1.2 mg/dL   GFR calc non Af Amer 37 (L) >90 mL/min   GFR calc Af Amer 42 (L) >90 mL/min    Comment: (NOTE) The eGFR has been calculated using the CKD EPI equation. This calculation has not been validated in all clinical situations. eGFR's  persistently <90 mL/min signify possible Chronic Kidney Disease.    Anion gap 17 (H) 5 - 15  Lactic acid, plasma     Status: Abnormal   Collection Time: 11/01/2014  5:39 PM  Result Value Ref Range   Lactic Acid, Venous 4.8 (H) 0.5 - 2.2 mmol/L  I-stat troponin, ED  Status: Abnormal   Collection Time: 11/02/2014  6:02 PM  Result Value Ref Range   Troponin i, poc 0.19 (HH) 0.00 - 0.08 ng/mL   Comment NOTIFIED PHYSICIAN    Comment 3            Comment: Due to the release kinetics of cTnI, a negative result within the first hours of the onset of symptoms does not rule out myocardial infarction with certainty. If myocardial infarction is still suspected, repeat the test at appropriate intervals.   I-Stat venous blood gas, ED     Status: Abnormal   Collection Time: 10/31/2014  6:05 PM  Result Value Ref Range   pH, Ven 7.347 (H) 7.250 - 7.300   pCO2, Ven 35.2 (L) 45.0 - 50.0 mmHg   pO2, Ven 97.0 (H) 30.0 - 45.0 mmHg   Bicarbonate 19.3 (L) 20.0 - 24.0 mEq/L   TCO2 20 0 - 100 mmol/L   O2 Saturation 97.0 %   Acid-base deficit 6.0 (H) 0.0 - 2.0 mmol/L   Collection site BRACHIAL ARTERY    Sample type VENOUS   I-Stat CG4 Lactic Acid, ED     Status: Abnormal   Collection Time: 10/13/2014  6:19 PM  Result Value Ref Range   Lactic Acid, Venous 4.90 (H) 0.5 - 2.2 mmol/L  Urinalysis, Routine w reflex microscopic     Status: Abnormal   Collection Time: 11/03/2014  6:26 PM  Result Value Ref Range   Color, Urine YELLOW YELLOW   APPearance CLOUDY (A) CLEAR   Specific Gravity, Urine 1.018 1.005 - 1.030   pH 5.0 5.0 - 8.0   Glucose, UA NEGATIVE NEGATIVE mg/dL   Hgb urine dipstick MODERATE (A) NEGATIVE   Bilirubin Urine NEGATIVE NEGATIVE   Ketones, ur NEGATIVE NEGATIVE mg/dL   Protein, ur NEGATIVE NEGATIVE mg/dL   Urobilinogen, UA 0.2 0.0 - 1.0 mg/dL   Nitrite NEGATIVE NEGATIVE   Leukocytes, UA NEGATIVE NEGATIVE  Urine microscopic-add on     Status: Abnormal   Collection Time: 10/11/2014  6:26 PM   Result Value Ref Range   Squamous Epithelial / LPF RARE RARE   WBC, UA 0-2 <3 WBC/hpf   RBC / HPF 0-2 <3 RBC/hpf   Bacteria, UA FEW (A) RARE   Casts HYALINE CASTS (A) NEGATIVE   Urine-Other MUCOUS PRESENT     Comment: AMORPHOUS URATES/PHOSPHATES  I-Stat CG4 Lactic Acid, ED     Status: Abnormal   Collection Time: 10/16/2014  9:15 PM  Result Value Ref Range   Lactic Acid, Venous 3.87 (H) 0.5 - 2.2 mmol/L   Dg Chest Port 1 View  10/17/2014   CLINICAL DATA:  Shortness of breath.  EXAM: PORTABLE CHEST - 1 VIEW  COMPARISON:  Mar 07, 2012.  FINDINGS: Stable cardiomediastinal silhouette. No pneumothorax or pleural effusion is noted. Left lung is clear. Mild right upper lobe opacity is noted concerning for focal atelectasis or possibly pneumonia. Bony thorax appears intact.  IMPRESSION: Faint right upper lobe opacity is noted concerning for subsegmental atelectasis or possibly pneumonia. Followup radiographs are recommended to ensure resolution.   Electronically Signed   By: Sabino Dick M.D.   On: 10/06/2014 18:19    Review of Systems  Unable to perform ROS: acuity of condition  Constitutional: Positive for fever.  Respiratory: Positive for cough and shortness of breath.     Blood pressure 139/65, pulse 102, temperature 98.7 F (37.1 C), temperature source Oral, resp. rate 24, weight 63.504 kg (140 lb), SpO2 96 %.  Physical Exam  Constitutional:  Elderly gentleman, moderate distress due to increased WOB, tachypnic.  HENT:  Head: Normocephalic and atraumatic.  Mouth/Throat: Oropharynx is clear and moist. No oropharyngeal exudate.  Eyes: EOM are normal. Pupils are equal, round, and reactive to light. No scleral icterus.  Conjunctiva injected right eye  Neck: Neck supple. No JVD present. No tracheal deviation present. No thyromegaly present.  Cardiovascular:  Tachy, regular, normal S1, S2, no MGR  Respiratory: No stridor. He is in respiratory distress. He has wheezes. He has rales.   tachypnic to upper 30s  GI: Soft. Bowel sounds are normal. He exhibits no distension. There is no tenderness. There is no rebound.  Musculoskeletal: He exhibits no edema.  Neurological: He is alert.  Skin: Skin is warm and dry. No rash noted.     Assessment/Plan Larry Underwood is a 80 yo man with h/o chronic diastolic CHF, AF, HTN, CKD stage II and prior PNA who presents from SNF with respiratory distress likely secondary to HCAP with sepsis.  Will admit to step-down for BiPAP prn WOB and will consider intubation if decompensates per discussion with son.  Will treat with vanc/cefepine as he resides in a SNF.  Will check cultures, RVP, [place], ULA.  Will obtain 2D echo tomorrow to assess LVF.  He currently does not appear volume overloaded but did receive 1 L in the ED for hypotension.  He may likely need diuresis but will hold off currently as he was hypotensive while in ED.  He has a h/o AF and medication list includes pradaxa, however, given age, renal function and body weight will hold tonight and decision on whether to continue can be made tomorrow.  ECG appears to be ST and given sepsis with hypotension will also hold cardizem.    Shelagh Rayman 10/13/2014, 10:30 PM

## 2014-10-21 NOTE — ED Notes (Signed)
Family at bedside. 

## 2014-10-21 NOTE — ED Notes (Signed)
Will continue to hold NS bolus due to BP 117/46.

## 2014-10-21 NOTE — ED Notes (Signed)
Elevated CG-4 reported to Dr. Rosalia Hammersay

## 2014-10-22 ENCOUNTER — Encounter: Payer: Self-pay | Admitting: Adult Health

## 2014-10-22 DIAGNOSIS — R7989 Other specified abnormal findings of blood chemistry: Secondary | ICD-10-CM

## 2014-10-22 DIAGNOSIS — R778 Other specified abnormalities of plasma proteins: Secondary | ICD-10-CM

## 2014-10-22 DIAGNOSIS — R059 Cough, unspecified: Secondary | ICD-10-CM | POA: Insufficient documentation

## 2014-10-22 DIAGNOSIS — I5032 Chronic diastolic (congestive) heart failure: Secondary | ICD-10-CM

## 2014-10-22 DIAGNOSIS — I4892 Unspecified atrial flutter: Secondary | ICD-10-CM

## 2014-10-22 DIAGNOSIS — N179 Acute kidney failure, unspecified: Secondary | ICD-10-CM

## 2014-10-22 DIAGNOSIS — I483 Typical atrial flutter: Secondary | ICD-10-CM

## 2014-10-22 DIAGNOSIS — R05 Cough: Secondary | ICD-10-CM | POA: Insufficient documentation

## 2014-10-22 LAB — MRSA PCR SCREENING: MRSA by PCR: POSITIVE — AB

## 2014-10-22 LAB — CBC
HCT: 32.5 % — ABNORMAL LOW (ref 39.0–52.0)
Hemoglobin: 10.5 g/dL — ABNORMAL LOW (ref 13.0–17.0)
MCH: 29.4 pg (ref 26.0–34.0)
MCHC: 32.3 g/dL (ref 30.0–36.0)
MCV: 91 fL (ref 78.0–100.0)
PLATELETS: 203 10*3/uL (ref 150–400)
RBC: 3.57 MIL/uL — ABNORMAL LOW (ref 4.22–5.81)
RDW: 14.9 % (ref 11.5–15.5)
WBC: 11 10*3/uL — AB (ref 4.0–10.5)

## 2014-10-22 LAB — BASIC METABOLIC PANEL
ANION GAP: 15 (ref 5–15)
BUN: 32 mg/dL — ABNORMAL HIGH (ref 6–23)
CALCIUM: 8.1 mg/dL — AB (ref 8.4–10.5)
CO2: 20 mEq/L (ref 19–32)
CREATININE: 1.51 mg/dL — AB (ref 0.50–1.35)
Chloride: 102 mEq/L (ref 96–112)
GFR, EST AFRICAN AMERICAN: 41 mL/min — AB (ref >90)
GFR, EST NON AFRICAN AMERICAN: 35 mL/min — AB (ref >90)
Glucose, Bld: 131 mg/dL — ABNORMAL HIGH (ref 70–99)
Potassium: 4.6 mEq/L (ref 3.7–5.3)
SODIUM: 137 meq/L (ref 137–147)

## 2014-10-22 LAB — URINE CULTURE
Colony Count: NO GROWTH
Culture: NO GROWTH

## 2014-10-22 LAB — STREP PNEUMONIAE URINARY ANTIGEN: Strep Pneumo Urinary Antigen: NEGATIVE

## 2014-10-22 LAB — TROPONIN I: TROPONIN I: 0.81 ng/mL — AB (ref <0.30)

## 2014-10-22 LAB — MAGNESIUM: MAGNESIUM: 1.8 mg/dL (ref 1.5–2.5)

## 2014-10-22 MED ORDER — CHLORHEXIDINE GLUCONATE CLOTH 2 % EX PADS
6.0000 | MEDICATED_PAD | Freq: Every day | CUTANEOUS | Status: AC
  Administered 2014-10-22 – 2014-10-26 (×4): 6 via TOPICAL

## 2014-10-22 MED ORDER — FINASTERIDE 5 MG PO TABS
5.0000 mg | ORAL_TABLET | Freq: Every day | ORAL | Status: DC
  Administered 2014-10-22 – 2014-10-24 (×3): 5 mg via ORAL
  Filled 2014-10-22 (×3): qty 1

## 2014-10-22 MED ORDER — SODIUM CHLORIDE 0.9 % IV SOLN: 250.0000 mL | INTRAVENOUS | Status: DC | PRN

## 2014-10-22 MED ORDER — DILTIAZEM HCL 30 MG PO TABS
30.0000 mg | ORAL_TABLET | Freq: Three times a day (TID) | ORAL | Status: DC
Start: 2014-10-22 — End: 2014-10-23
  Administered 2014-10-22 – 2014-10-23 (×2): 30 mg via ORAL
  Filled 2014-10-22 (×6): qty 1

## 2014-10-22 MED ORDER — DABIGATRAN ETEXILATE MESYLATE 75 MG PO CAPS
75.0000 mg | ORAL_CAPSULE | Freq: Two times a day (BID) | ORAL | Status: DC
  Administered 2014-10-22 – 2014-10-24 (×5): 75 mg via ORAL
  Filled 2014-10-22 (×6): qty 1

## 2014-10-22 MED ORDER — LEVOTHYROXINE SODIUM 75 MCG PO TABS
75.0000 ug | ORAL_TABLET | Freq: Every day | ORAL | Status: DC
  Administered 2014-10-22 – 2014-10-24 (×3): 75 ug via ORAL
  Filled 2014-10-22 (×4): qty 1

## 2014-10-22 MED ORDER — MUPIROCIN 2 % EX OINT
1.0000 "application " | TOPICAL_OINTMENT | Freq: Two times a day (BID) | CUTANEOUS | Status: AC
  Administered 2014-10-22 – 2014-10-26 (×10): 1 via NASAL
  Filled 2014-10-22 (×2): qty 22

## 2014-10-22 MED ORDER — FUROSEMIDE 10 MG/ML IJ SOLN
20.0000 mg | Freq: Once | INTRAMUSCULAR | Status: AC
  Administered 2014-10-23: 20 mg via INTRAVENOUS
  Filled 2014-10-22: qty 2

## 2014-10-22 MED ORDER — SACCHAROMYCES BOULARDII 250 MG PO CAPS
250.0000 mg | ORAL_CAPSULE | Freq: Two times a day (BID) | ORAL | Status: DC
  Administered 2014-10-22 – 2014-10-24 (×5): 250 mg via ORAL
  Filled 2014-10-22 (×6): qty 1

## 2014-10-22 NOTE — Progress Notes (Addendum)
[facility] Progress Note   Larry Underwood ZOX:[id number] DOB: [birth year] DOA: 20-Mar-2014 PCP: Kimber RelicGREEN, ARTHUR G, MD  Brief narrative: Larry Underwood is a [age] y.o. male with a past medical history of diastolic heart failure, atrial flutter on pradaxa, hypertension, CKD3 and prior pneumonia who presents from skilled nursing facility due to respiratory distress and is found to have a right upper lobe infiltrate, fever of 102.9, WBC count of 11.3, lactic acidosis, mildly elevated troponin at 0.19 and mild increase in creatinine from baseline.   Subjective: Evaluated in the morning while on BiPAP. Awakens and attempts to communicate. Respiratory rate goes up from high 20s into the 30s when awake.  Assessment/Plan: Principal Problem:   Sepsis due to pneumonia -Continue vancomycin and cefepime-note MRSA Green is positive -Add florastor -Continue BiPAP until respiratory rate improves-currently receiving 30% FiO2 with good O2 saturation -Follow up blood cultures and respiratory virus panel- as respiratory virus panel takes 3-4 days to result, we'll obtain influenza PCR now  Active Problems:    AKI on CKD (chronic kidney disease), stage III -Lasix on hold-agree with hydration    Chronic diastolic heart failure -As mentioned above, Lasix on hold  Elevated troponin I -Will recheck-this is likely secondary to respiratory distress    Atrial flutter -Rate is currently well-controlled-will resume short-acting low-dose Cardizem to prevent rate from becoming uncontrolled - Resume Pradaxa    Hypothyroidism -Continue Synthroid  BPH -Continue finasteride   Code Status: Full code Family Communication:  Disposition Plan: SNF DVT prophylaxis: Pradaxa  Consultants: none  Procedures: none  Antibiotics: Anti-infectives    Start     Dose/Rate Route Frequency Ordered Stop   10/22/14 1800  ceFEPIme (MAXIPIME) 1 g in dextrose 5 % 50 mL IVPB     1 g100 mL/hr over 30 Minutes  Intravenous Every 24 hours 2013-12-19 1853     10/22/14 1800  vancomycin (VANCOCIN) IVPB 750 mg/150 ml premix     750 mg150 mL/hr over 60 Minutes Intravenous Every 24 hours 2013-12-19 1853     2013-12-19 1815  ceFEPIme (MAXIPIME) 2 g in dextrose 5 % 50 mL IVPB     2 g100 mL/hr over 30 Minutes Intravenous  Once 2013-12-19 1802 2013-12-19 1852   2013-12-19 1815  vancomycin (VANCOCIN) IVPB 1000 mg/200 mL premix     1,000 mg200 mL/hr over 60 Minutes Intravenous  Once 2013-12-19 1802 2013-12-19 1930         Objective: Filed Weights   2013-12-19 1917 2013-12-19 2231 10/22/14 0427  Weight: 63.504 kg (140 lb) 63.6 kg (140 lb 3.4 oz) 64.4 kg (141 lb 15.6 oz)    Intake/Output Summary (Last 24 hours) at 10/22/14 1119 Last data filed at 10/22/14 0600  Gross per 24 hour  Intake      0 ml  Output    250 ml  Net   -250 ml     Vitals Filed Vitals:   10/22/14 0600 10/22/14 0700 10/22/14 0756 10/22/14 0825  BP: 123/67   108/59  Pulse: 88 83 85 69  Temp:    98.8 F (37.1 C)  TempSrc: Oral   Oral  Resp: 38 28 17 33  Height:      Weight:      SpO2: 98% 98% 99% 100%    Exam: General: awakens when called, moderate amount of resp distress with tachypnea Lungs: Clear to auscultation bilaterally without wheezes or crackles Cardiovascular: Regular rate and rhythm without murmur gallop or rub normal S1 and S2 Abdomen: Nontender,  nondistended, soft, bowel sounds positive, no rebound, no ascites, no appreciable mass Extremities: No significant cyanosis, clubbing, or edema bilateral lower extremities  Data Reviewed: Basic Metabolic Panel:  Recent Labs Lab 07-13-14 1739 10/22/14 0248  NA 133* 137  K 4.3 4.6  CL 97 102  CO2 19 20  GLUCOSE 201* 131*  BUN 30* 32*  CREATININE 1.47* 1.51*  CALCIUM 8.2* 8.1*  MG  --  1.8   Liver Function Tests:  Recent Labs Lab 07-13-14 1739  AST 47*  ALT 22  ALKPHOS 84  BILITOT 0.8  PROT 6.7  ALBUMIN 2.9*   No results for input(s): LIPASE, AMYLASE in the last 168  hours. No results for input(s): AMMONIA in the last 168 hours. CBC:  Recent Labs Lab 07-13-14 1739 10/22/14 0248  WBC 11.7* 11.0*  NEUTROABS 10.8*  --   HGB 11.4* 10.5*  HCT 35.4* 32.5*  MCV 91.0 91.0  PLT 149* 203   Cardiac Enzymes: No results for input(s): CKTOTAL, CKMB, CKMBINDEX, TROPONINI in the last 168 hours. BNP (last 3 results)  Recent Labs  07-13-14 1739  PROBNP 8315.0*   CBG: No results for input(s): GLUCAP in the last 168 hours.  Recent Results (from the past 240 hour(s))  MRSA PCR Screening     Status: Abnormal   Collection Time: 10/22/14 12:06 AM  Result Value Ref Range Status   MRSA by PCR POSITIVE (A) NEGATIVE Final    Comment:        The GeneXpert MRSA Assay (FDA approved for NASAL specimens only), is one component of a comprehensive MRSA colonization surveillance program. It is not intended to diagnose MRSA infection nor to guide or monitor treatment for MRSA infections. RESULT CALLED TO, READ BACK BY AND VERIFIED WITH: Louanne Skye TOMLINSON,RN [phone] WILDERK      Studies:  Recent x-ray studies have been reviewed in detail by the Attending Physician  Scheduled Meds:  Scheduled Meds: . antiseptic oral rinse  15 mL Mouth Rinse 6 times per day  . ceFEPime (MAXIPIME) IV  1 g Intravenous Q24H  . Chlorhexidine Gluconate Cloth  6 each Topical Q0600  . dabigatran  75 mg Oral Q12H  . diltiazem  30 mg Oral 3 times per day  . finasteride  5 mg Oral Daily  . levothyroxine  75 mcg Oral QAC breakfast  . mupirocin ointment  1 application Nasal BID  . saccharomyces boulardii  250 mg Oral BID  . sodium chloride  3 mL Intravenous Q12H  . sodium chloride  3 mL Intravenous Q12H  . vancomycin  750 mg Intravenous Q24H   Continuous Infusions:   Time spent on care of this patient: 35 min   Suprena Travaglini, MD 10/22/2014, 11:19 AM  LOS: 1 day   [facility] Office  [phone] Pager - Text Page per [email or web address]  If 7PM-7AM, please contact  night-coverage [email or web address]

## 2014-10-22 NOTE — Progress Notes (Signed)
Patient ID: Larry RouxRobert D Underwood, male   DOB: [birth year], [age] y.o.   MRN: [id number]    Nursing Home Location:  [facility]   Place of Service: SNF (31)  PCP: Kimber RelicGREEN, Larry G, MD  No Known Allergies  Chief Complaint  Patient presents with  . Acute Visit    sob, decreased o2 sat    HPI:  Patient is a [age] y.o. male [facility] due to decreased 02 sats. He has a hx of afib, HTN, CHF, abnormal gait, and HLD. He previously lived in independent living but was moved on 10/26/2014 to a skilled bed due to weakness and possible pna. The staff reports that he is usually mentally sharp but somewhat forgetful of the details of his care today. He has some increased wob and his sats were 88% on RA, so oxygen at 2L was applied and his sat increased to 94%. He has a cough and was seen in the clinic yesterday and placed on Augmentin for 7 days. An xray of his chest was performed on 10/20/14 showing mild CHF, no infiltrate. Last echo in 2011 shows an EF of 65%.  His son is here at the bedside and reports that he has been weaker over the past 8 months and has had some bouts of coughing with meals and previous bouts of pna.  The resident is denying SOB now and is up in the chair eating his lunch.  Review of Systems:  Review of Systems  Constitutional: Negative for fever, activity change, appetite change, fatigue and unexpected weight change.       Larry SchmidtFraili elderly male.  HENT: Positive for trouble swallowing. Negative for congestion, postnasal drip, rhinorrhea and sore throat.        Bilateral hearing loss.  Respiratory: Positive for cough and wheezing. Negative for shortness of breath.   Cardiovascular: Negative for chest pain, palpitations and leg swelling.       AF  Gastrointestinal: Negative for constipation and abdominal distention.  Genitourinary: Negative.   Musculoskeletal:       Generalized weakness. No pain. Unstable gait; using cane.  Skin:  Negative for pallor, rash and wound.  Neurological: Positive for tremors. Negative for seizures, syncope, speech difficulty, weakness and numbness.  Psychiatric/Behavioral: Negative.     Past Medical History  Diagnosis Date  . CHF (congestive heart failure) 01/04/2010  . Atrial fib/flutter, transient 10/10/2011  . Hypertension 12/27/2009  . Other abnormal blood chemistry 03/24/2012    Hyperglycemia  . Temporomandibular joint sounds on opening and/or closing the jaw 10/15/2011  . Hypertrophy of prostate with urinary obstruction and other lower urinary tract symptoms (LUTS) 03/19/2011  . Abnormality of gait 12/11/2010  . Nocturia 12/11/2010  . Unspecified urinary incontinence 12/11/2010  . Unspecified hypothyroidism 06/05/2010  . Chronic kidney disease, stage II (mild) 06/05/2010  . Shortness of breath 02/20/2010  . Hearing loss 01/06/2010  . Debility, unspecified 01/04/2010  . Long term (current) use of anticoagulants 01/04/2010  . Other and unspecified hyperlipidemia 12/27/2009  . Essential and other specified forms of tremor 12/27/2009  . Senile cataract, unspecified 12/27/2009  . Coronary atherosclerosis of native coronary artery 01/07/2003  . Reflux esophagitis 01/07/2003  . Diaphragmatic hernia without mention of obstruction or gangrene 01/07/2003   Past Surgical History  Procedure Laterality Date  . Cardiac surgery    . Appendectomy    . Inguinal hernia repair Right 1940  . Inguinal hernia repair Right 1990    Re-do with mesh  Social History:   reports that he has never smoked. He has never used smokeless tobacco. He reports that he drinks about 0.6 oz of alcohol per week. He reports that he does not use illicit drugs.  History reviewed. No pertinent family history.  Medications: Patient's Medications  New Prescriptions   No medications on file  Previous Medications   ACETAMINOPHEN (TYLENOL) 500 MG TABLET    Take 500 mg by mouth every 6 (six) hours as needed for  mild pain or headache.    AMOXICILLIN-CLAVULANATE (AUGMENTIN) 875-125 MG PER TABLET    Take 1 tablet by mouth 2 (two) times daily.   CARTIA XT 120 MG 24 HR CAPSULE    TAKE 1 CAPSULE DAILY TO CONTROL HEART RHYTHM.   FINASTERIDE (PROSCAR) 5 MG TABLET    TAKE 1 TABLET BY MOUTH DAILY FOR PROSTATE   FUROSEMIDE (LASIX) 20 MG TABLET    Take 20 mg by mouth daily.   IPRATROPIUM-ALBUTEROL (DUONEB) 0.5-2.5 (3) MG/3ML SOLN    Take 3 mLs by nebulization every 6 (six) hours as needed (Shortness of breath).   LEVOTHYROXINE (SYNTHROID, LEVOTHROID) 75 MCG TABLET    1 daily for thyroid supplement   MULTIPLE VITAMINS-MINERALS (MULTIVITAMIN WITH MINERALS) TABLET    Take 1 tablet by mouth 2 (two) times daily.   PRADAXA 75 MG CAPS CAPSULE    TAKE ONE CAPSULE BY MOUTH EVERY MORNING AND EVERY EVENING FOR ANTICOAGULATION   SACCHAROMYCES BOULARDII (FLORASTOR) 250 MG CAPSULE    Take 250 mg by mouth 2 (two) times daily.   SIMVASTATIN (ZOCOR) 40 MG TABLET    TAKE 1 TABLET BY MOUTH EVERY DAY  Modified Medications   No medications on file  Discontinued Medications   CARTIA XT 120 MG 24 HR CAPSULE    TAKE ONE CAPSULE BY MOUTH DAILY TO CONTROL HEART RHYTHM   MULTIPLE VITAMINS-MINERALS (PRESERVISION AREDS PO)    Take 1 tablet by mouth 2 (two) times daily.       Physical Exam: Filed Vitals:   11/02/2014 1219  BP: 100/54  Pulse: 90  Temp: 97.8 F (36.6 C)  Resp: 20  SpO2: 88%    Physical Exam  Constitutional: No distress.  frail  HENT:  Head: Normocephalic and atraumatic.  Right Ear: External ear normal.  Left Ear: External ear normal.  Nose: Nose normal.  Mouth/Throat: Oropharynx is clear and moist. No oropharyngeal exudate.  Bilateral partial deafness.   Eyes: Conjunctivae and EOM are normal. Pupils are equal, round, and reactive to light.  Bilateral lower lid ectropion.  Neck: Neck supple. No JVD present.  Cardiovascular: Normal rate and normal heart sounds.   AF Absent popliteal, DP, and PT pulses  bilaterally  Pulmonary/Chest: No respiratory distress. He has wheezes. He has rhonchi. He has rales. He exhibits no tenderness.  Increase rate   Abdominal: He exhibits no distension and no mass. There is no tenderness.  Musculoskeletal: He exhibits no edema or tenderness.  Lymphadenopathy:    He has no cervical adenopathy.  Neurological: He is alert. No cranial nerve deficit. Coordination normal.  Skin: Skin is warm and dry.  Onychomycosis lipoma of occiput Sebaceous cyst of th right cheek   Psychiatric:  Confused at this time   Diagnostic studies reviewed   2D echo 2011  Study Conclusions  - Left ventricle: The cavity size was normal. Wall thickness was  increased in a pattern of mild LVH. Systolic function was  vigorous. The estimated ejection fraction was in the range of 65%  to 70%. Wall motion was normal; there were no regional wall motion  abnormalities. - Mitral valve: Moderate regurgitation. - Left atrium: The atrium was mildly dilated.  Labs reviewed: Basic Metabolic Panel:  Recent Labs  45/40/9803/26/15 11-08-2013 1739 10/22/14 0248  NA 138 133* 137  K 4.7 4.3 4.6  CL  --  97 102  CO2  --  19 20  GLUCOSE  --  201* 131*  BUN 26* 30* 32*  CREATININE 1.3 1.47* 1.51*  CALCIUM  --  8.2* 8.1*  MG  --   --  1.8   Liver Function Tests:  Recent Labs  11-08-2013 1739  AST 47*  ALT 22  ALKPHOS 84  BILITOT 0.8  PROT 6.7  ALBUMIN 2.9*   No results for input(s): LIPASE, AMYLASE in the last 8760 hours. No results for input(s): AMMONIA in the last 8760 hours. CBC:  Recent Labs  11-08-2013 1739 10/22/14 0248  WBC 11.7* 11.0*  NEUTROABS 10.8*  --   HGB 11.4* 10.5*  HCT 35.4* 32.5*  MCV 91.0 91.0  PLT 149* 203   TSH:  Recent Labs  10/27/13 01/28/14  TSH 6.77* 4.82   A1C: Lab Results  Component Value Date   HGBA1C 6.5* 01/28/2014   Lipid Panel:  Recent Labs  10/27/13 01/28/14  CHOL 114 106  HDL 55 47  LDLCALC 43 40  TRIG 84 71     Assessment and Plan  CHF (congestive heart failure) Mild CHF noted on xray on 10/20/14.  No JVD and minimal edema. Mildly increased WOB. Begin Lasix 20mg  qd for 3 days. BMP in the morning.   Cough No infiltrate noted on xray but given his symptoms he most likely has pna. His son reports that he has had several bouts of pna and coughing with meals. Continue Augmentin, continue duonebs. Begin oxygen and titrate for sats>90%.  ST to eval and tx ?dysphagia, aspiration? I have spoke to his son and he feels that if his symptoms progress he would like him to go to the hospital. He has minimal increase WOB at this point and now that he his on oxygen his sats are 94%.  He is eating his lunch in the chair now and denies feeling SOB. I have asked the staff to monitor his VS qshift and if his symptoms worsen to send him to the hospital. He remains a full code at this point.   I spent 45 min on this case with the resident, nurse, son, and in review of the chart.  Peggye Leyhristy Gurney Balthazor, ANP [facility] [phone]

## 2014-10-22 NOTE — Assessment & Plan Note (Signed)
No infiltrate noted on xray but given his symptoms he most likely has pna. His son reports that he has had several bouts of pna and coughing with meals. Continue Augmentin, continue duonebs. Begin oxygen and titrate for sats>90%.  ST to eval and tx ?dysphagia, aspiration? I have spoke to his son and he feels that if his symptoms progress he would like him to go to the hospital. He has minimal increase WOB at this point and now that he his on oxygen his sats are 94%.  He is eating his lunch in the chair now and denies feeling SOB. I have asked the staff to monitor his VS qshift and if his symptoms worsen to send him to the hospital. He remains a full code at this point.

## 2014-10-22 NOTE — Assessment & Plan Note (Signed)
Mild CHF noted on xray on 10/20/14.  No JVD and minimal edema. Mildly increased WOB. Begin Lasix 20mg  qd for 3 days. BMP in the morning.

## 2014-10-22 NOTE — Progress Notes (Signed)
Pt RR in the 40s  Without bipap.

## 2014-10-23 ENCOUNTER — Inpatient Hospital Stay (HOSPITAL_COMMUNITY): Payer: Medicare Other

## 2014-10-23 DIAGNOSIS — N179 Acute kidney failure, unspecified: Secondary | ICD-10-CM

## 2014-10-23 DIAGNOSIS — I4892 Unspecified atrial flutter: Secondary | ICD-10-CM

## 2014-10-23 DIAGNOSIS — N183 Chronic kidney disease, stage 3 (moderate): Secondary | ICD-10-CM

## 2014-10-23 DIAGNOSIS — R7989 Other specified abnormal findings of blood chemistry: Secondary | ICD-10-CM

## 2014-10-23 DIAGNOSIS — A419 Sepsis, unspecified organism: Principal | ICD-10-CM

## 2014-10-23 DIAGNOSIS — I5032 Chronic diastolic (congestive) heart failure: Secondary | ICD-10-CM

## 2014-10-23 DIAGNOSIS — J189 Pneumonia, unspecified organism: Secondary | ICD-10-CM

## 2014-10-23 LAB — BASIC METABOLIC PANEL
Anion gap: 17 — ABNORMAL HIGH (ref 5–15)
BUN: 40 mg/dL — ABNORMAL HIGH (ref 6–23)
CALCIUM: 8.5 mg/dL (ref 8.4–10.5)
CO2: 21 mEq/L (ref 19–32)
Chloride: 100 mEq/L (ref 96–112)
Creatinine, Ser: 1.34 mg/dL (ref 0.50–1.35)
GFR, EST AFRICAN AMERICAN: 47 mL/min — AB (ref >90)
GFR, EST NON AFRICAN AMERICAN: 41 mL/min — AB (ref >90)
Glucose, Bld: 114 mg/dL — ABNORMAL HIGH (ref 70–99)
Potassium: 3.9 mEq/L (ref 3.7–5.3)
SODIUM: 138 meq/L (ref 137–147)

## 2014-10-23 LAB — CBC
HCT: 33.1 % — ABNORMAL LOW (ref 39.0–52.0)
Hemoglobin: 11.1 g/dL — ABNORMAL LOW (ref 13.0–17.0)
MCH: 30.4 pg (ref 26.0–34.0)
MCHC: 33.5 g/dL (ref 30.0–36.0)
MCV: 90.7 fL (ref 78.0–100.0)
PLATELETS: 142 10*3/uL — AB (ref 150–400)
RBC: 3.65 MIL/uL — ABNORMAL LOW (ref 4.22–5.81)
RDW: 14.6 % (ref 11.5–15.5)
WBC: 7.7 10*3/uL (ref 4.0–10.5)

## 2014-10-23 LAB — TROPONIN I: TROPONIN I: 1.05 ng/mL — AB (ref <0.30)

## 2014-10-23 MED ORDER — DIGOXIN 0.25 MG/ML IJ SOLN
0.2500 mg | Freq: Once | INTRAMUSCULAR | Status: AC
  Administered 2014-10-23: 0.25 mg via INTRAVENOUS
  Filled 2014-10-23 (×2): qty 1

## 2014-10-23 MED ORDER — SODIUM CHLORIDE 0.9 % IV BOLUS (SEPSIS)
250.0000 mL | Freq: Once | INTRAVENOUS | Status: AC
  Administered 2014-10-23: 250 mL via INTRAVENOUS

## 2014-10-23 MED ORDER — IPRATROPIUM-ALBUTEROL 0.5-2.5 (3) MG/3ML IN SOLN
3.0000 mL | Freq: Four times a day (QID) | RESPIRATORY_TRACT | Status: DC
  Administered 2014-10-23 – 2014-10-27 (×19): 3 mL via RESPIRATORY_TRACT
  Filled 2014-10-23 (×19): qty 3

## 2014-10-23 MED ORDER — FUROSEMIDE 10 MG/ML IJ SOLN
20.0000 mg | Freq: Once | INTRAMUSCULAR | Status: AC
  Administered 2014-10-23: 20 mg via INTRAVENOUS
  Filled 2014-10-23: qty 2

## 2014-10-23 MED ORDER — ALBUTEROL SULFATE (2.5 MG/3ML) 0.083% IN NEBU: 2.5000 mg | INHALATION_SOLUTION | RESPIRATORY_TRACT | Status: DC | PRN

## 2014-10-23 MED ORDER — DILTIAZEM HCL 100 MG IV SOLR
5.0000 mg/h | INTRAVENOUS | Status: DC
  Administered 2014-10-23: 15 mg/h via INTRAVENOUS
  Administered 2014-10-23: 5 mg/h via INTRAVENOUS
  Administered 2014-10-24 – 2014-10-27 (×10): 15 mg/h via INTRAVENOUS
  Administered 2014-10-27: 5 mg/h via INTRAVENOUS

## 2014-10-23 MED ORDER — SODIUM CHLORIDE 0.9 % IV SOLN: 250.0000 mL | INTRAVENOUS | Status: DC | PRN

## 2014-10-23 NOTE — Progress Notes (Addendum)
[facility] Progress Note   Larry Underwood ZOX:[id number] DOB: 11-Nov-1908 DOA: 10/16/2014 PCP: Kimber Relic, MD  Brief narrative: Larry Underwood is a 80 y.o. male with a past medical history of diastolic heart failure, atrial flutter on pradaxa, hypertension, CKD3 and prior pneumonia who presents from skilled nursing facility due to respiratory distress and is found to have a right upper lobe infiltrate, fever of 102.9, WBC count of 11.3, lactic acidosis, mildly elevated troponin at 0.19 and mild increase in creatinine from baseline.   Subjective: Awake and alert on BiPAP. Tachypnea. Became quite short of breath overnight and was given 2 doses of 20 mg of Lasix IV.  Assessment/Plan: Principal Problem:   Sepsis due to pneumonia -Continue vancomycin and cefepime-note MRSA screen is positive -Added florastor -Have asked for a pulmonary consult due to continued respiratory distress - per Pulm, he is not a candidate for intubation and after their discussion with the patient's family, he is now a partial code  - Continue BiPAP until respiratory rate improves- -Follow up blood cultures and respiratory virus panel-  influenza PCR pending  Active Problems:  Atrial flutter -Due to respiratory distress and possibly due to diuretics overnight -Placed on Cardizem infusion-slow bolus of normal saline 250 mL -Cardiology asked to assist with management-dig being loaded -Continue Pradaxa  Elevated troponin I -Not a cardiac cath candidate-elevation likely secondary to acute distress    AKI on CKD (chronic kidney disease), stage III -Improved    Chronic diastolic heart failure - Lasix on hold    Hypothyroidism -Continue Synthroid  BPH -Continue finasteride   Code Status: Partial code-DO NOT INTUBATE and do not perform CPR-use BiPAP, administering ACLS meds and defibrillate Family Communication: With son Disposition Plan: To be determined DVT prophylaxis:  Pradaxa  Consultants: none  Procedures: none  Antibiotics: Anti-infectives    Start     Dose/Rate Route Frequency Ordered Stop   10/22/14 1800  ceFEPIme (MAXIPIME) 1 g in dextrose 5 % 50 mL IVPB     1 g100 mL/hr over 30 Minutes Intravenous Every 24 hours 10/08/2014 1853     10/22/14 1800  vancomycin (VANCOCIN) IVPB 750 mg/150 ml premix     750 mg150 mL/hr over 60 Minutes Intravenous Every 24 hours 10/25/2014 1853     10/11/2014 1815  ceFEPIme (MAXIPIME) 2 g in dextrose 5 % 50 mL IVPB     2 g100 mL/hr over 30 Minutes Intravenous  Once 10/17/2014 1802 10/20/2014 1852   11/01/2014 1815  vancomycin (VANCOCIN) IVPB 1000 mg/200 mL premix     1,000 mg200 mL/hr over 60 Minutes Intravenous  Once 10/14/2014 1802 10/26/2014 1930         Objective: Filed Weights   10/05/2014 2231 10/22/14 0427 10/23/14 0500  Weight: 63.6 kg (140 lb 3.4 oz) 64.4 kg (141 lb 15.6 oz) 62.2 kg (137 lb 2 oz)    Intake/Output Summary (Last 24 hours) at 10/23/14 1505 Last data filed at 10/22/14 2300  Gross per 24 hour  Intake    260 ml  Output      0 ml  Net    260 ml     Vitals Filed Vitals:   10/23/14 1053 10/23/14 1154 10/23/14 1200 10/23/14 1250  BP:  132/79 157/79   Pulse:  135 132 132  Temp:  99.2 F (37.3 C)    TempSrc:  Oral    Resp:  53  55  Height:      Weight:  SpO2: 98% 91% 90% 92%    Exam: General: Awake alert oriented 3, moderate amount of resp distress with tachypnea Lungs: Bilateral rhonchi, no crackles or wheezing Cardiovascular: Regular rate and rhythm -tachycardic-without murmur gallop or rub normal S1 and S2 Abdomen: Nontender, nondistended, soft, bowel sounds positive, no rebound, no ascites, no appreciable mass Extremities: No significant cyanosis, clubbing, or edema bilateral lower extremities  Data Reviewed: Basic Metabolic Panel:  Recent Labs Lab 10/09/2014 1739 10/22/14 0248 10/23/14 0250  NA 133* 137 138  K 4.3 4.6 3.9  CL 97 102 100  CO2 19 20 21   GLUCOSE 201* 131*  114*  BUN 30* 32* 40*  CREATININE 1.47* 1.51* 1.34  CALCIUM 8.2* 8.1* 8.5  MG  --  1.8  --    Liver Function Tests:  Recent Labs Lab 10/26/2014 1739  AST 47*  ALT 22  ALKPHOS 84  BILITOT 0.8  PROT 6.7  ALBUMIN 2.9*   No results for input(s): LIPASE, AMYLASE in the last 168 hours. No results for input(s): AMMONIA in the last 168 hours. CBC:  Recent Labs Lab 10/13/2014 1739 10/22/14 0248 10/23/14 0250  WBC 11.7* 11.0* 7.7  NEUTROABS 10.8*  --   --   HGB 11.4* 10.5* 11.1*  HCT 35.4* 32.5* 33.1*  MCV 91.0 91.0 90.7  PLT 149* 203 142*   Cardiac Enzymes:  Recent Labs Lab 10/22/14 1224 10/23/14 0928  TROPONINI 0.81* 1.05*   BNP (last 3 results)  Recent Labs  10/20/2014 1739  PROBNP 8315.0*   CBG: No results for input(s): GLUCAP in the last 168 hours.  Recent Results (from the past 240 hour(s))  Blood Culture (routine x 2)     Status: None (Preliminary result)   Collection Time: 10/24/2014  6:10 PM  Result Value Ref Range Status   Specimen Description BLOOD ARM RIGHT  Final   Special Requests BOTTLES DRAWN AEROBIC AND ANAEROBIC 5CC  Final   Culture  Setup Time   Final    10/22/2014 01:21 Performed at [facility]    Culture   Final           BLOOD CULTURE RECEIVED NO GROWTH TO DATE CULTURE WILL BE HELD FOR 5 DAYS BEFORE ISSUING A FINAL NEGATIVE REPORT Performed at [facility]    Report Status PENDING  Incomplete  Blood Culture (routine x 2)     Status: None (Preliminary result)   Collection Time: 10/11/2014  6:20 PM  Result Value Ref Range Status   Specimen Description BLOOD ARM LEFT  Final   Special Requests BOTTLES DRAWN AEROBIC AND ANAEROBIC 5CC  Final   Culture  Setup Time   Final    10/22/2014 01:21 Performed at [facility]    Culture   Final           BLOOD CULTURE RECEIVED NO GROWTH TO DATE CULTURE WILL BE HELD FOR 5 DAYS BEFORE ISSUING A FINAL NEGATIVE REPORT Performed at [facility]    Report Status PENDING   Incomplete  Urine culture     Status: None   Collection Time: 10/13/2014  6:26 PM  Result Value Ref Range Status   Specimen Description URINE, CATHETERIZED  Final   Special Requests NONE  Final   Culture  Setup Time   Final    10/22/2014 01:21 Performed at [facility]    Colony Count NO GROWTH Performed at [facility]   Final   Culture NO GROWTH Performed at [facility]  Final   Report Status 10/22/2014 FINAL  Final  MRSA PCR Screening     Status: Abnormal   Collection Time: 10/22/14 12:06 AM  Result Value Ref Range Status   MRSA by PCR POSITIVE (A) NEGATIVE Final    Comment:        The GeneXpert MRSA Assay (FDA approved for NASAL specimens only), is one component of a comprehensive MRSA colonization surveillance program. It is not intended to diagnose MRSA infection nor to guide or monitor treatment for MRSA infections. RESULT CALLED TO, READ BACK BY AND VERIFIED WITH: Louanne Skye TOMLINSON,RN [phone] [place]      Studies:  Recent x-ray studies have been reviewed in detail by the Attending Physician  Scheduled Meds:  Scheduled Meds: . antiseptic oral rinse  15 mL Mouth Rinse 6 times per day  . ceFEPime (MAXIPIME) IV  1 g Intravenous Q24H  . Chlorhexidine Gluconate Cloth  6 each Topical Q0600  . dabigatran  75 mg Oral Q12H  . digoxin  0.25 mg Intravenous Once  . finasteride  5 mg Oral Daily  . ipratropium-albuterol  3 mL Nebulization QID  . levothyroxine  75 mcg Oral QAC breakfast  . mupirocin ointment  1 application Nasal BID  . saccharomyces boulardii  250 mg Oral BID  . sodium chloride  250 mL Intravenous Once  . sodium chloride  3 mL Intravenous Q12H  . sodium chloride  3 mL Intravenous Q12H  . vancomycin  750 mg Intravenous Q24H   Continuous Infusions: . diltiazem (CARDIZEM) infusion 5 mg/hr (10/23/14 1312)    Time spent on care of this patient: 35 min   Bristyl Mclees, MD 10/23/2014, 3:05 PM  LOS: 2 days   [facility] Office  [phone] Pager - Text Page per [email or web address]  If 7PM-7AM, please contact night-coverage [email or web address]

## 2014-10-23 NOTE — Consult Note (Signed)
PULMONARY / CRITICAL CARE MEDICINE   Name: Larry Underwood MRN: [id number] DOB: 1909/09/20    ADMISSION DATE:  07-08-14 CONSULTATION DATE:  10/23/14 --   REFERRING MD :  Dr Butler Denmarkizwan, [facility]  CHIEF COMPLAINT:  Acute respiratory failure  INITIAL PRESENTATION: 80 yo man, hx A Fib, diastolic dysfxn, CKD, admitted with resp distress and suspected RUL HCAP on 12/17, associated sepsis picture. Has received volume resuscitation. 12/18 more respiratory distress requiring BiPAP. Has not improved, increasing WOB, PCCM consulted 12/19.   STUDIES:   SIGNIFICANT EVENTS: BiPAP started 12/18  HISTORY OF PRESENT ILLNESS:  Larry Underwood is a [age] yo man with h/o chronic diastolic CHF, AF, HTN, CKD stage II. Per notes he has experienced a functional decline over the last 8 months, has had some swallowing difficulty and has been treated for serial PNA's. He [facility] for dyspnea, fever, cough and started augmentin. Then was evaluated 12/17 and [facility]. He has been treated with vanco, cefepime, has required BiPAP. He has also received volume resuscitation for presumed sepsis. Has continued to require BiPAP. PCCM consulted to evaluate him on 12/19.   PAST MEDICAL HISTORY :   has a past medical history of CHF (congestive heart failure) (01/04/2010); Atrial fib/flutter, transient (10/10/2011); Hypertension (12/27/2009); Other abnormal blood chemistry (03/24/2012); Temporomandibular joint sounds on opening and/or closing the jaw (10/15/2011); Hypertrophy of prostate with urinary obstruction and other lower urinary tract symptoms (LUTS) (03/19/2011); Abnormality of gait (12/11/2010); Nocturia (12/11/2010); Unspecified urinary incontinence (12/11/2010); Unspecified hypothyroidism (06/05/2010); Chronic kidney disease, stage II (mild) (06/05/2010); Shortness of breath (02/20/2010); Hearing loss (01/06/2010); Debility, unspecified (01/04/2010); Long term (current) use of anticoagulants  (01/04/2010); Other and unspecified hyperlipidemia (12/27/2009); Essential and other specified forms of tremor (12/27/2009); Senile cataract, unspecified (12/27/2009); Coronary atherosclerosis of native coronary artery (01/07/2003); Reflux esophagitis (01/07/2003); and Diaphragmatic hernia without mention of obstruction or gangrene (01/07/2003).  has past surgical history that includes Cardiac surgery; Appendectomy; Inguinal hernia repair (Right, 1940); and Inguinal hernia repair (Right, 1990). Prior to Admission medications   Medication Sig Start Date End Date Taking? Authorizing Provider  amoxicillin-clavulanate (AUGMENTIN) 875-125 MG per tablet Take 1 tablet by mouth 2 (two) times daily.   Yes Historical Provider, MD  CARTIA XT 120 MG 24 hr capsule TAKE 1 CAPSULE DAILY TO CONTROL HEART RHYTHM. 08/30/14  Yes Kimber RelicArthur G Green, MD  finasteride (PROSCAR) 5 MG tablet TAKE 1 TABLET BY MOUTH DAILY FOR PROSTATE 07/19/14  Yes Kimber RelicArthur G Green, MD  furosemide (LASIX) 20 MG tablet Take 20 mg by mouth daily.   Yes Historical Provider, MD  ipratropium-albuterol (DUONEB) 0.5-2.5 (3) MG/3ML SOLN Take 3 mLs by nebulization every 6 (six) hours as needed (Shortness of breath).   Yes Historical Provider, MD  Multiple Vitamins-Minerals (MULTIVITAMIN WITH MINERALS) tablet Take 1 tablet by mouth 2 (two) times daily.   Yes Historical Provider, MD  PRADAXA 75 MG CAPS capsule TAKE ONE CAPSULE BY MOUTH EVERY MORNING AND EVERY EVENING FOR ANTICOAGULATION 09/17/14  Yes Kimber RelicArthur G Green, MD  saccharomyces boulardii (FLORASTOR) 250 MG capsule Take 250 mg by mouth 2 (two) times daily.   Yes Historical Provider, MD  simvastatin (ZOCOR) 40 MG tablet TAKE 1 TABLET BY MOUTH EVERY DAY 05/27/14  Yes Kimber RelicArthur G Green, MD  acetaminophen (TYLENOL) 500 MG tablet Take 500 mg by mouth every 6 (six) hours as needed for mild pain or headache.     Historical Provider, MD  levothyroxine (SYNTHROID, LEVOTHROID) 75 MCG tablet 1  daily for thyroid  supplement Patient taking differently: Take 75 mcg by mouth daily before breakfast. 1 daily for thyroid supplement 05/11/13   Kimber RelicArthur G Green, MD   No Known Allergies  FAMILY HISTORY:  indicated that his mother is deceased. He indicated that his father is deceased. He indicated that both of his sisters are deceased. He indicated that his brother is deceased. He indicated that both of his daughters are alive. He indicated that his son is alive.  SOCIAL HISTORY:  reports that he has never smoked. He has never used smokeless tobacco. He reports that he drinks about 0.6 oz of alcohol per week. He reports that he does not use illicit drugs.  REVIEW OF SYSTEMS:  Pt is unable to give  SUBJECTIVE:  C/o being thirsty  VITAL SIGNS: Temp:  [98 F (36.7 C)-98.9 F (37.2 C)] 98.5 F (36.9 C) (12/19 0756) Pulse Rate:  [41-129] 104 (12/19 0800) Resp:  [28-48] 35 (12/19 0800) BP: (110-153)/(50-89) 153/82 mmHg (12/19 0800) SpO2:  [97 %-100 %] 98 % (12/19 0800) FiO2 (%):  [30 %] 30 % (12/19 0400) Weight:  [62.2 kg (137 lb 2 oz)] 62.2 kg (137 lb 2 oz) (12/19 0500) HEMODYNAMICS:   VENTILATOR SETTINGS: Vent Mode:  [-]  FiO2 (%):  [30 %] 30 % INTAKE / OUTPUT:  Intake/Output Summary (Last 24 hours) at 10/23/14 [id number] Last data filed at 10/22/14 2300  Gross per 24 hour  Intake    260 ml  Output      0 ml  Net    260 ml    PHYSICAL EXAMINATION: General:  Elderly man in some mild resp distress on BiPAP Neuro:  Awake, attempting to speak but mask makes communication difficult,  HEENT:  Op appears dry, no lesions, R eye with some redness, a lot of UA noise w ? secretions Cardiovascular:  Tachy, irregular Lungs:  B exp wheezes and some scattered crackles Abdomen:  benign Musculoskeletal:  No deformities Skin:  No rash or breakdown.   LABS:  CBC  Recent Labs Lab 03/21/2014 1739 10/22/14 0248 10/23/14 0250  WBC 11.7* 11.0* 7.7  HGB 11.4* 10.5* 11.1*  HCT 35.4* 32.5* 33.1*  PLT 149* 203 142*    Coag's No results for input(s): APTT, INR in the last 168 hours. BMET  Recent Labs Lab 03/21/2014 1739 10/22/14 0248 10/23/14 0250  NA 133* 137 138  K 4.3 4.6 3.9  CL 97 102 100  CO2 19 20 21   BUN 30* 32* 40*  CREATININE 1.47* 1.51* 1.34  GLUCOSE 201* 131* 114*   Electrolytes  Recent Labs Lab 03/21/2014 1739 10/22/14 0248 10/23/14 0250  CALCIUM 8.2* 8.1* 8.5  MG  --  1.8  --    Sepsis Markers  Recent Labs Lab 03/21/2014 1739 03/21/2014 1819 03/21/2014 2115  LATICACIDVEN 4.8* 4.90* 3.87*   ABG No results for input(s): PHART, PCO2ART, PO2ART in the last 168 hours. Liver Enzymes  Recent Labs Lab 03/21/2014 1739  AST 47*  ALT 22  ALKPHOS 84  BILITOT 0.8  ALBUMIN 2.9*   Cardiac Enzymes  Recent Labs Lab 03/21/2014 1739 10/22/14 1224  TROPONINI  --  0.81*  PROBNP 8315.0*  --    Glucose No results for input(s): GLUCAP in the last 168 hours.  Imaging No results found.   ASSESSMENT / PLAN:  PULMONARY A: Acute respiratory failure, possibly due to RUL PNA (although infiltrate is extremely subtle on 12/17 CXR) vs decompensated heart failure or primary cardiac cause.  P:   -  agree with BiPAP as he can tolerate, attempt to provide him w breaks - agree with abx for presumed R HCAP - CXR this am is pending > will help us delineate between PNA and more diffuse process, CHF or ALI - he is wheezing, believe it would be reasonable to schedule his albuterol / ipratropium nebs for now - consider NTS to see if his UA noise improves - careful with aggressive fluids as he is high risk for cardiogenic pulm edema; he may require some diuresis if BP will tolerate - Note he has been functional at baseline, desires aggressive care. Even so he is a very poor candidate for MV given his age and comorbidities. I would not recommend intubation or mechanical ventilation for this patient as I do not believe he would survive it. I will discuss with him and his family   CARDIOVASCULAR A:  A Fib Diastolic CHF HTN P:  - note lasix given last pm 12/19 - dilt scheduled - pradaxa  RENAL A:  CKD P:   - follow BMP - careful diuresis if he can tolerate  GASTROINTESTINAL A:  NPO P:   - aspiration precautions  INFECTIOUS A:  R HCAP P:   BCx2 12/17 >>  UC 12/17 >> negative RSV 12/17 >>   vanco 12/18 >>  Cefepime 12/18 >>   ENDOCRINE A:  Hypothyroidism    P:   - synthroid ordered  NEUROLOGIC A:  Toxic metabolic encephalopathy P:   RASS goal: 0 - careful with sedating meds   FAMILY  - Updates: spoke with pt's daughter Waynetta SandyBeth at bedside, pt's son Nadine CountsBob by phone. They understand that he is tenuous, that this illness could go either way. I have supported full medical care and BiPAP as he requires and can tolerate. NTS as well as increased BD's. I have advised against intubation and MV because I do not believe Larry Underwood would survive these. His children understand this and agree. PCCM will follow with you  Pt's son Nadine CountsBob, [phone] (c), [phone] (h), [phone] (w).   - Inter-disciplinary family meet or Palliative Care meeting due by:  12/26    TODAY'S SUMMARY:   Independent CC time 60 minutes   Levy Pupaobert Rashun Grattan, MD, PhD 10/23/2014, 10:08 AM  Pulmonary and Critical Care [phone] or if no answer [phone]

## 2014-10-23 NOTE — Progress Notes (Signed)
Dr  Butler Denmarkizwan at bedsided ,assessing PT . See Flow sheet for  Shift assessment and  VS ,etc..Marland Kitchen..Marland Kitchen

## 2014-10-23 NOTE — Progress Notes (Signed)
Text message sent to Dr Butler Denmarkizwan . Pt placed back on Bipap (was satting 90% on 5L/[place] )  = 55 hoarseness = 133 ( A-Fib) before/p = 155/79,SpO2 = 93% .Resps labored w/audible wet lung sounds(rales) .

## 2014-10-23 NOTE — Consult Note (Signed)
CARDIOLOGY CONSULT NOTE   Patient ID: Larry Underwood MRN: [id number] DOB/AGE: Mar 15, 1909 80 y.o.  Admit Date: 11-14-2014 Referring Physician: Jabier Gauss MD Primary Physician: Kimber Relic, MD Consulting Cardiologist: Cassell Clement MD Primary Cardiologist: New (Formerly Meade Maw MD). Reason for Consultation: Atrial fib with RVR  Clinical Summary Larry Underwood is a 80 y.o.male admitted 2 on 2014-11-14, in respiratory distress, with HCAP, sepsis, with hx of atrial fib on Pradaxa, hypertension, hypothyroidism and CKD, who is a resident of [facility].He is currently on BIPAP and is unable to answer questions. History is obtained from current and past medical records. Family members state that he began to have worsening breathing 3 days prior to coming in to ER.    He was found to be febrile on arrival to ER with T-Max of 102.9, hypotensive 100/54, O2 Sat of 88%, Lactic Acid of 4.90. Leukocytosis 11.7.  Pro-BNP 8,315.  Creatinine of 1.47, sodium of 147. CXR subsegmental atelectasis or possibly pneumonia. Now being treated with IV antibiotics, vancomycin and Maxipime.Being followed by pulmonary. Not a good candidate for intubation. Blood cultures are pending.  We are asked to see for atrial fib with RVR. Rates are 132 bpm. He was started on diltiazem gtt but unable to titrate due to hypotension.                                 No Known Allergies  Medications Scheduled Medications: . antiseptic oral rinse  15 mL Mouth Rinse 6 times per day  . ceFEPime (MAXIPIME) IV  1 g Intravenous Q24H  . Chlorhexidine Gluconate Cloth  6 each Topical Q0600  . dabigatran  75 mg Oral Q12H  . finasteride  5 mg Oral Daily  . ipratropium-albuterol  3 mL Nebulization QID  . levothyroxine  75 mcg Oral QAC breakfast  . mupirocin ointment  1 application Nasal BID  . saccharomyces boulardii  250 mg Oral BID  . sodium chloride  250 mL Intravenous Once  . sodium chloride  3 mL Intravenous Q12H    . sodium chloride  3 mL Intravenous Q12H  . vancomycin  750 mg Intravenous Q24H    Infusions: . diltiazem (CARDIZEM) infusion 5 mg/hr (10/23/14 1312)    PRN Medications: sodium chloride, acetaminophen **OR** acetaminophen, albuterol, sodium chloride   Past Medical History  Diagnosis Date  . CHF (congestive heart failure) 01/04/2010  . Atrial fib/flutter, transient 10/10/2011  . Hypertension 12/27/2009  . Other abnormal blood chemistry 03/24/2012    Hyperglycemia  . Temporomandibular joint sounds on opening and/or closing the jaw 10/15/2011  . Hypertrophy of prostate with urinary obstruction and other lower urinary tract symptoms (LUTS) 03/19/2011  . Abnormality of gait 12/11/2010  . Nocturia 12/11/2010  . Unspecified urinary incontinence 12/11/2010  . Unspecified hypothyroidism 06/05/2010  . Chronic kidney disease, stage II (mild) 06/05/2010  . Shortness of breath 02/20/2010  . Hearing loss 01/06/2010  . Debility, unspecified 01/04/2010  . Long term (current) use of anticoagulants 01/04/2010  . Other and unspecified hyperlipidemia 12/27/2009  . Essential and other specified forms of tremor 12/27/2009  . Senile cataract, unspecified 12/27/2009  . Coronary atherosclerosis of native coronary artery 01/07/2003  . Reflux esophagitis 01/07/2003  . Diaphragmatic hernia without mention of obstruction or gangrene 01/07/2003    Past Surgical History  Procedure Laterality Date  . Cardiac surgery    . Appendectomy    . Inguinal hernia repair Right  1940  . Inguinal hernia repair Right 1990    Re-do with mesh    No family history on file.  Social History Larry Underwood reports that he has never smoked. He has never used smokeless tobacco. Larry Underwood reports that he drinks about 0.6 oz of alcohol per week.  Review of Systems Complete review of systems are found to be negative unless outlined in H&P above.  Physical Examination Blood pressure 157/79, pulse 132, temperature  99.2 F (37.3 C), temperature source Oral, resp. rate 55, height 5\' 8"  (1.727 m), weight 137 lb 2 oz (62.2 kg), SpO2 92 %.  Intake/Output Summary (Last 24 hours) at 10/23/14 1401 Last data filed at 10/22/14 2300  Gross per 24 hour  Intake    260 ml  Output      0 ml  Net    260 ml    Telemetry: Atrial fib/flutter rate of 132 bpm.   GEN: Tachypneic HEENT: Conjunctiva and lids normal, oropharynx clear with moist mucosa. Neck: Supple, no elevated JVP or carotid bruits, no thyromegaly. Lungs: Very coarse, bilateral rhonchi, some slight respiratory wheezes. On BIPAP Cardiac: Unable to auscultate heart sounds due to lung sounds,.  Abdomen: Soft, nontender, no hepatomegaly, bowel sounds present, no guarding or rebound. Extremities: No pitting edema, distal pulses 2+. Skin: Warm and dry. Musculoskeletal: No kyphosis. Neuropsychiatric: Alert and oriented x3, affect grossly appropriate.  Prior Cardiac Testing/Procedures 1.Echocardiogram 10/29/2010 Left ventricle: The cavity size was normal. Wall thickness was  increased in a pattern of mild LVH. Systolic function was  vigorous. The estimated ejection fraction was in the range of 65%  to 70%. Wall motion was normal; there were no regional wall motion  abnormalities. - Mitral valve: Moderate regurgitation. - Left atrium: The atrium was mildly dilated. - Pulmonary arteries: PA peak pressure: 33mm Hg (S).  2.Cardiac Cath 07/2003 FINAL IMPRESSION: 1. Critical disease in the proximal right coronary artery.99%.  2. Preserved left ventricular function. 3. Calcification in the mitral anulus.  3.PTCA of of the RCA 07/2003 PTCA of this area and is status post stent with post PTCA stenosis being 0%. He was placed on aspirin and Plavix   Lab Results  Basic Metabolic Panel:  Recent Labs Lab 10/26/2014 1739 10/22/14 0248 10/23/14 0250  NA 133* 137 138  K 4.3 4.6 3.9  CL 97 102 100  CO2 19 20 21   GLUCOSE 201* 131* 114*   BUN 30* 32* 40*  CREATININE 1.47* 1.51* 1.34  CALCIUM 8.2* 8.1* 8.5  MG  --  1.8  --     Liver Function Tests:  Recent Labs Lab 10/05/2014 1739  AST 47*  ALT 22  ALKPHOS 84  BILITOT 0.8  PROT 6.7  ALBUMIN 2.9*    CBC:  Recent Labs Lab 10/15/2014 1739 10/22/14 0248 10/23/14 0250  WBC 11.7* 11.0* 7.7  NEUTROABS 10.8*  --   --   HGB 11.4* 10.5* 11.1*  HCT 35.4* 32.5* 33.1*  MCV 91.0 91.0 90.7  PLT 149* 203 142*    Cardiac Enzymes:  Recent Labs Lab 10/22/14 1224 10/23/14 0928  TROPONINI 0.81* 1.05*    Radiology: Dg Chest Port 1 View  10/23/2014   CLINICAL DATA:  Sepsis and shortness of breath.  EXAM: PORTABLE CHEST - 1 VIEW  COMPARISON:  10/06/2014  FINDINGS: Patchy densities in the right lung could be related to atelectasis but nonspecific. Few densities at the left lung base are unchanged. Left upper lung is clear. Heart and mediastinum are stable.  IMPRESSION: Patchy densities in the right lung are nonspecific. No large areas of consolidation. Findings could be related to atelectasis. Atypical infection cannot be excluded.   Electronically Signed   By: Richarda OverlieAdam  Henn M.D.   On: 10/23/2014 10:02   Dg Chest Port 1 View  10/30/2014   CLINICAL DATA:  Shortness of breath.  EXAM: PORTABLE CHEST - 1 VIEW  COMPARISON:  Mar 07, 2012.  FINDINGS: Stable cardiomediastinal silhouette. No pneumothorax or pleural effusion is noted. Left lung is clear. Mild right upper lobe opacity is noted concerning for focal atelectasis or possibly pneumonia. Bony thorax appears intact.  IMPRESSION: Faint right upper lobe opacity is noted concerning for subsegmental atelectasis or possibly pneumonia. Followup radiographs are recommended to ensure resolution.   Electronically Signed   By: Roque LiasJames  Green M.D.   On: 10/18/2014 18:19     ECG: Atrial fib/flutter    Impression and Recommendations  1. Atrial fib/flutter with RVR: Know history of atrial fib on Prodaxa. Not on AV nodal blockers at review  of home medications. Currently on diltiazem 15 mg/hour IV. Limited treatment options due to age and kidney function  2. HCAP: Currently in respiratory distress with BiPAP and multiple IV antibiotics, and neb tx. Blood cultures are pending. Consider changing to Xopenex to have less affect on HR.   3. CAD: Has not been seen by cardiology since 2004, had cardiac cath with PTCA and stenting to RCA. He is not currently on BB, ASA of statin. Troponin is elevated, likely due to demand ischemia.   4. CHF: Echo will have to wait until heart rate is better controlled in order to help with management.  5. CKD Stage II:  Creatinine 1.34 GFR 41.   Signed: Bettey MareKathryn M. Boruch Manuele NP AACC  10/23/2014, 2:01 PM Co-Sign MD

## 2014-10-24 DIAGNOSIS — J189 Pneumonia, unspecified organism: Secondary | ICD-10-CM | POA: Insufficient documentation

## 2014-10-24 LAB — CBC
HCT: 33.4 % — ABNORMAL LOW (ref 39.0–52.0)
HEMOGLOBIN: 10.9 g/dL — AB (ref 13.0–17.0)
MCH: 30 pg (ref 26.0–34.0)
MCHC: 32.6 g/dL (ref 30.0–36.0)
MCV: 92 fL (ref 78.0–100.0)
Platelets: 142 10*3/uL — ABNORMAL LOW (ref 150–400)
RBC: 3.63 MIL/uL — ABNORMAL LOW (ref 4.22–5.81)
RDW: 14.4 % (ref 11.5–15.5)
WBC: 8 10*3/uL (ref 4.0–10.5)

## 2014-10-24 LAB — BASIC METABOLIC PANEL
ANION GAP: 13 (ref 5–15)
BUN: 56 mg/dL — ABNORMAL HIGH (ref 6–23)
CHLORIDE: 101 meq/L (ref 96–112)
CO2: 27 mEq/L (ref 19–32)
CREATININE: 1.64 mg/dL — AB (ref 0.50–1.35)
Calcium: 8.2 mg/dL — ABNORMAL LOW (ref 8.4–10.5)
GFR, EST AFRICAN AMERICAN: 37 mL/min — AB (ref >90)
GFR, EST NON AFRICAN AMERICAN: 32 mL/min — AB (ref >90)
Glucose, Bld: 150 mg/dL — ABNORMAL HIGH (ref 70–99)
Potassium: 3.6 mEq/L — ABNORMAL LOW (ref 3.7–5.3)
Sodium: 141 mEq/L (ref 137–147)

## 2014-10-24 LAB — VANCOMYCIN, TROUGH: Vancomycin Tr: 15.9 ug/mL (ref 10.0–20.0)

## 2014-10-24 MED ORDER — DIGOXIN 0.25 MG/ML IJ SOLN
0.2500 mg | Freq: Once | INTRAMUSCULAR | Status: AC
  Administered 2014-10-24: 0.25 mg via INTRAVENOUS
  Filled 2014-10-24: qty 1

## 2014-10-24 MED ORDER — LEVOTHYROXINE SODIUM 100 MCG IV SOLR
37.5000 ug | Freq: Every day | INTRAVENOUS | Status: DC
  Administered 2014-10-25 – 2014-10-27 (×3): 37.5 ug via INTRAVENOUS
  Filled 2014-10-24 (×3): qty 5

## 2014-10-24 MED ORDER — ENOXAPARIN SODIUM 80 MG/0.8ML ~~LOC~~ SOLN
65.0000 mg | SUBCUTANEOUS | Status: DC
  Administered 2014-10-25: 65 mg via SUBCUTANEOUS
  Filled 2014-10-24 (×2): qty 0.8

## 2014-10-24 MED ORDER — MORPHINE SULFATE 2 MG/ML IJ SOLN
2.0000 mg | Freq: Once | INTRAMUSCULAR | Status: AC
  Administered 2014-10-24: 2 mg via INTRAVENOUS
  Filled 2014-10-24: qty 1

## 2014-10-24 MED ORDER — POTASSIUM CHLORIDE CRYS ER 20 MEQ PO TBCR
40.0000 meq | EXTENDED_RELEASE_TABLET | ORAL | Status: AC
  Administered 2014-10-24: 40 meq via ORAL
  Filled 2014-10-24: qty 2

## 2014-10-24 MED ORDER — DIGOXIN 0.25 MG/ML IJ SOLN
0.1250 mg | Freq: Once | INTRAMUSCULAR | Status: DC
  Filled 2014-10-24: qty 0.5

## 2014-10-24 MED ORDER — SODIUM CHLORIDE 0.9 % IV SOLN
250.0000 mL | INTRAVENOUS | Status: DC | PRN
Start: 2014-10-24 — End: 2014-10-27

## 2014-10-24 NOTE — Progress Notes (Signed)
RN called me to bedside to eval pt. Pt w/ increased RR= 50.  Pt w/ vt 400-500, leak 20-22.  Dr. Delton CoombesByrum at bedside, wants trial of morphine for WOB and RR issues.  Cardiology MD at bedside also to eval pt.  Per MD, continue bipap for now, and trial off if/when pt able/tolerates.  No new respiratory orders received.

## 2014-10-24 NOTE — Progress Notes (Signed)
Discussed code status with the family - they realize he is very unlikely to survive resuscitation, especially with partial code efforts. Discussed with Larry Underwood, his wife and entire family - they are in agreement with full DNR.  Larry Underwood. Larry Willard, MD, [facility] Attending Cardiologist [facility]

## 2014-10-24 NOTE — Progress Notes (Signed)
MD at bedside, request to trial pt off bipap, pt also request trial off bipap.  Pt placed on 5 lpm [place], pt tol well so far.  Pt denies SOB.  No distress currently noted. VSS.

## 2014-10-24 NOTE — Progress Notes (Signed)
DAILY PROGRESS NOTE  Subjective:  Remains tachycardic, tachypneic on bipap with desaturations overnight. Clinically struggling.  Objective:  Temp:  [97.7 F (36.5 C)-99.2 F (37.3 C)] 97.7 F (36.5 C) (12/20 0800) Pulse Rate:  [38-135] 131 (12/20 0934) Resp:  [23-55] 32 (12/20 0934) BP: (37-157)/(23-80) 131/51 mmHg (12/20 0800) SpO2:  [90 %-100 %] 97 % (12/20 0934) FiO2 (%):  [40 %] 40 % (12/20 0729) Weight:  [137 lb 12.6 oz (62.5 kg)] 137 lb 12.6 oz (62.5 kg) (12/20 0500) Weight change: 10.6 oz (0.3 kg)  Intake/Output from previous day: 12/19 0701 - 12/20 0700 In: 470 [I.V.:270; IV Piggyback:200] Out: 1275 [Urine:1275]  Intake/Output from this shift:    Medications: Current Facility-Administered Medications  Medication Dose Route Frequency Provider Last Rate Last Dose  . 0.9 %  sodium chloride infusion  250 mL Intravenous PRN Debbe Odea, MD      . acetaminophen (TYLENOL) tablet 650 mg  650 mg Oral Q6H PRN Alphia Moh, MD       Or  . acetaminophen (TYLENOL) suppository 650 mg  650 mg Rectal Q6H PRN Alphia Moh, MD      . albuterol (PROVENTIL) (2.5 MG/3ML) 0.083% nebulizer solution 2.5 mg  2.5 mg Nebulization Q4H PRN Collene Gobble, MD      . antiseptic oral rinse (BIOTENE) solution 15 mL  15 mL Mouth Rinse 6 times per day Alphia Moh, MD   15 mL at 10/24/14 0800  . ceFEPIme (MAXIPIME) 1 g in dextrose 5 % 50 mL IVPB  1 g Intravenous Q24H Shaune Pollack, MD   1 g at 10/23/14 1759  . Chlorhexidine Gluconate Cloth 2 % PADS 6 each  6 each Topical Q0600 Alphia Moh, MD   6 each at 10/24/14 0458  . dabigatran (PRADAXA) capsule 75 mg  75 mg Oral Q12H Debbe Odea, MD   75 mg at 10/24/14 0855  . digoxin (LANOXIN) 0.25 MG/ML injection 0.125 mg  0.125 mg Intravenous Once Pixie Casino, MD      . diltiazem (CARDIZEM) 100 mg in dextrose 5 % 100 mL (1 mg/mL) infusion  5-15 mg/hr Intravenous Titrated Debbe Odea, MD 15 mL/hr at 10/24/14 0236 15 mg/hr at  10/24/14 0236  . finasteride (PROSCAR) tablet 5 mg  5 mg Oral Daily Debbe Odea, MD   5 mg at 10/24/14 0855  . ipratropium-albuterol (DUONEB) 0.5-2.5 (3) MG/3ML nebulizer solution 3 mL  3 mL Nebulization QID Collene Gobble, MD   3 mL at 10/24/14 0728  . levothyroxine (SYNTHROID, LEVOTHROID) tablet 75 mcg  75 mcg Oral QAC breakfast Debbe Odea, MD   75 mcg at 10/24/14 0855  . mupirocin ointment (BACTROBAN) 2 % 1 application  1 application Nasal BID Alphia Moh, MD   1 application at 62/56/38 [phone]  . potassium chloride SA (K-DUR,KLOR-CON) CR tablet 40 mEq  40 mEq Oral Q4H Debbe Odea, MD   40 mEq at 10/24/14 0855  . saccharomyces boulardii (FLORASTOR) capsule 250 mg  250 mg Oral BID Debbe Odea, MD   250 mg at 10/24/14 0855  . sodium chloride 0.9 % injection 3 mL  3 mL Intravenous Q12H Alphia Moh, MD   3 mL at 10/23/14 1045  . sodium chloride 0.9 % injection 3 mL  3 mL Intravenous Q12H Alphia Moh, MD   3 mL at 10/24/14 0854  . sodium chloride 0.9 % injection 3 mL  3 mL Intravenous PRN Alphia Moh, MD      . vancomycin (  VANCOCIN) IVPB 750 mg/150 ml premix  750 mg Intravenous Q24H Shaune Pollack, MD   750 mg at 10/23/14 1759    Physical Exam: General appearance: alert and moderate distress Lungs: diminished breath sounds bilaterally, rales bilaterally and rhonchi bilaterally Heart: irregularly irregular rhythm and tachycardic Extremities: extremities normal, atraumatic, no cyanosis or edema Pulses: 2+ and symmetric  Lab Results: Results for orders placed or performed during the hospital encounter of 10/19/2014 (from the past 48 hour(s))  Troponin I     Status: Abnormal   Collection Time: 10/22/14 12:24 PM  Result Value Ref Range   Troponin I 0.81 (HH) <0.30 ng/mL    Comment:        Due to the release kinetics of cTnI, a negative result within the first hours of the onset of symptoms does not rule out myocardial infarction with certainty. If myocardial infarction is  still suspected, repeat the test at appropriate intervals. CRITICAL RESULT CALLED TO, READ BACK BY AND VERIFIED WITH: T SHELTON,RN 1309 10/22/14 D BRADLEY   Basic metabolic panel     Status: Abnormal   Collection Time: 10/23/14  2:50 AM  Result Value Ref Range   Sodium 138 137 - 147 mEq/L   Potassium 3.9 3.7 - 5.3 mEq/L   Chloride 100 96 - 112 mEq/L   CO2 21 19 - 32 mEq/L   Glucose, Bld 114 (H) 70 - 99 mg/dL   BUN 40 (H) 6 - 23 mg/dL   Creatinine, Ser 1.34 0.50 - 1.35 mg/dL   Calcium 8.5 8.4 - 10.5 mg/dL   GFR calc non Af Amer 41 (L) >90 mL/min   GFR calc Af Amer 47 (L) >90 mL/min    Comment: (NOTE) The eGFR has been calculated using the CKD EPI equation. This calculation has not been validated in all clinical situations. eGFR's persistently <90 mL/min signify possible Chronic Kidney Disease.    Anion gap 17 (H) 5 - 15  CBC     Status: Abnormal   Collection Time: 10/23/14  2:50 AM  Result Value Ref Range   WBC 7.7 4.0 - 10.5 K/uL   RBC 3.65 (L) 4.22 - 5.81 MIL/uL   Hemoglobin 11.1 (L) 13.0 - 17.0 g/dL   HCT 33.1 (L) 39.0 - 52.0 %   MCV 90.7 78.0 - 100.0 fL   MCH 30.4 26.0 - 34.0 pg   MCHC 33.5 30.0 - 36.0 g/dL   RDW 14.6 11.5 - 15.5 %   Platelets 142 (L) 150 - 400 K/uL    Comment: DELTA CHECK NOTED REPEATED TO VERIFY SPECIMEN CHECKED FOR CLOTS   Troponin I (q 6hr x 3)     Status: Abnormal   Collection Time: 10/23/14  9:28 AM  Result Value Ref Range   Troponin I 1.05 (HH) <0.30 ng/mL    Comment:        Due to the release kinetics of cTnI, a negative result within the first hours of the onset of symptoms does not rule out myocardial infarction with certainty. If myocardial infarction is still suspected, repeat the test at appropriate intervals. CRITICAL VALUE NOTED.  VALUE IS CONSISTENT WITH PREVIOUSLY REPORTED AND CALLED VALUE.   Basic metabolic panel     Status: Abnormal   Collection Time: 10/24/14  2:42 AM  Result Value Ref Range   Sodium 141 137 - 147 mEq/L     Potassium 3.6 (L) 3.7 - 5.3 mEq/L   Chloride 101 96 - 112 mEq/L   CO2 27 19 -  32 mEq/L   Glucose, Bld 150 (H) 70 - 99 mg/dL   BUN 56 (H) 6 - 23 mg/dL   Creatinine, Ser 1.64 (H) 0.50 - 1.35 mg/dL   Calcium 8.2 (L) 8.4 - 10.5 mg/dL   GFR calc non Af Amer 32 (L) >90 mL/min   GFR calc Af Amer 37 (L) >90 mL/min    Comment: (NOTE) The eGFR has been calculated using the CKD EPI equation. This calculation has not been validated in all clinical situations. eGFR's persistently <90 mL/min signify possible Chronic Kidney Disease.    Anion gap 13 5 - 15  CBC     Status: Abnormal   Collection Time: 10/24/14  2:42 AM  Result Value Ref Range   WBC 8.0 4.0 - 10.5 K/uL   RBC 3.63 (L) 4.22 - 5.81 MIL/uL   Hemoglobin 10.9 (L) 13.0 - 17.0 g/dL   HCT 33.4 (L) 39.0 - 52.0 %   MCV 92.0 78.0 - 100.0 fL   MCH 30.0 26.0 - 34.0 pg   MCHC 32.6 30.0 - 36.0 g/dL   RDW 14.4 11.5 - 15.5 %   Platelets 142 (L) 150 - 400 K/uL    Imaging: Dg Chest Port 1 View  10/23/2014   CLINICAL DATA:  Sepsis and shortness of breath.  EXAM: PORTABLE CHEST - 1 VIEW  COMPARISON:  11/02/2014  FINDINGS: Patchy densities in the right lung could be related to atelectasis but nonspecific. Few densities at the left lung base are unchanged. Left upper lung is clear. Heart and mediastinum are stable.  IMPRESSION: Patchy densities in the right lung are nonspecific. No large areas of consolidation. Findings could be related to atelectasis. Atypical infection cannot be excluded.   Electronically Signed   By: Markus Daft M.D.   On: 10/23/2014 10:02    Assessment:  1. Principal Problem: 2.   Sepsis due to pneumonia 3. Active Problems: 4.   Hypothyroidism 5.   CKD (chronic kidney disease), stage III 6.   Chronic diastolic heart failure 7.   Atrial flutter 8.   AKI (acute kidney injury) 9.   Elevated troponin I level 10.   Plan:  1. Continues to struggle with respiration. Remains in a-fib with RVR. Will give additional digoxin  today - BP may tolerate it. Partial code, however, with lack of airway management availability, he would not likely survive an arrest. Would consider DNR discussion - palliative care may be appropriate at this point.  Time Spent Directly with Patient:  15 minutes  Length of Stay:  LOS: 3 days   Pixie Casino, MD, [facility] Attending Cardiologist [facility]  Anjel Perfetti C 10/24/2014, 10:48 AM

## 2014-10-24 NOTE — Progress Notes (Signed)
ANTIBIOTIC CONSULT NOTE - FOLLOW UP  Pharmacy Consult for Vancomycin and Cefepime Indication: HCAP  No Known Allergies  Patient Measurements: Height: 5\' 8"  (172.7 cm) Weight: 137 lb 12.6 oz (62.5 kg) IBW/kg (Calculated) : 68.4 Adjusted Body Weight:   Vital Signs: Temp: 98.1 F (36.7 C) (12/20 1200) BP: 117/52 mmHg (12/20 1800) Pulse Rate: 65 (12/20 1800) Intake/Output from previous day: 12/19 0701 - 12/20 0700 In: 470 [I.V.:270; IV Piggyback:200] Out: 1275 [Urine:1275] Intake/Output from this shift:    Labs:  Recent Labs  10/22/14 0248 10/23/14 0250 10/24/14 0242  WBC 11.0* 7.7 8.0  HGB 10.5* 11.1* 10.9*  PLT 203 142* 142*  CREATININE 1.51* 1.34 1.64*   Estimated Creatinine Clearance: 19.6 mL/min (by C-G formula based on Cr of 1.64).  Recent Labs  10/24/14 1720  VANCOTROUGH 15.9     Microbiology: Recent Results (from the past 720 hour(s))  Blood Culture (routine x 2)     Status: None (Preliminary result)   Collection Time: 02-16-2014  6:10 PM  Result Value Ref Range Status   Specimen Description BLOOD ARM RIGHT  Final   Special Requests BOTTLES DRAWN AEROBIC AND ANAEROBIC 5CC  Final   Culture  Setup Time   Final    10/22/2014 01:21 Performed at [facility]    Culture   Final           BLOOD CULTURE RECEIVED NO GROWTH TO DATE CULTURE WILL BE HELD FOR 5 DAYS BEFORE ISSUING A FINAL NEGATIVE REPORT Performed at [facility]    Report Status PENDING  Incomplete  Blood Culture (routine x 2)     Status: None (Preliminary result)   Collection Time: 02-16-2014  6:20 PM  Result Value Ref Range Status   Specimen Description BLOOD ARM LEFT  Final   Special Requests BOTTLES DRAWN AEROBIC AND ANAEROBIC 5CC  Final   Culture  Setup Time   Final    10/22/2014 01:21 Performed at [facility]    Culture   Final           BLOOD CULTURE RECEIVED NO GROWTH TO DATE CULTURE WILL BE HELD FOR 5 DAYS BEFORE ISSUING A FINAL NEGATIVE REPORT Performed at  [facility]    Report Status PENDING  Incomplete  Urine culture     Status: None   Collection Time: 02-16-2014  6:26 PM  Result Value Ref Range Status   Specimen Description URINE, CATHETERIZED  Final   Special Requests NONE  Final   Culture  Setup Time   Final    10/22/2014 01:21 Performed at [facility]    Colony Count NO GROWTH Performed at [facility]   Final   Culture NO GROWTH Performed at [facility]   Final   Report Status 10/22/2014 FINAL  Final  MRSA PCR Screening     Status: Abnormal   Collection Time: 10/22/14 12:06 AM  Result Value Ref Range Status   MRSA by PCR POSITIVE (A) NEGATIVE Final    Comment:        The GeneXpert MRSA Assay (FDA approved for NASAL specimens only), is one component of a comprehensive MRSA colonization surveillance program. It is not intended to diagnose MRSA infection nor to guide or monitor treatment for MRSA infections. RESULT CALLED TO, READ BACK BY AND VERIFIED WITHLouanne Skye: T TOMLINSON,RN [phone] [facility]     Anti-infectives    Start     Dose/Rate Route Frequency Ordered Stop   10/22/14 1800  ceFEPIme (MAXIPIME) 1 g in dextrose 5 % 50 mL IVPB     1 g100 mL/hr over 30 Minutes Intravenous Every 24 hours 10/13/2014 1853     10/22/14 1800  vancomycin (VANCOCIN) IVPB 750 mg/150 ml premix     750 mg150 mL/hr over 60 Minutes Intravenous Every 24 hours 10/19/2014 1853     10/16/2014 1815  ceFEPIme (MAXIPIME) 2 g in dextrose 5 % 50 mL IVPB     2 g100 mL/hr over 30 Minutes Intravenous  Once 10/05/2014 1802 10/13/2014 1852   10/13/2014 1815  vancomycin (VANCOCIN) IVPB 1000 mg/200 mL premix     1,000 mg200 mL/hr over 60 Minutes Intravenous  Once 10/14/2014 1802 10/23/2014 1930      Assessment: Larry Underwood on Vancomycin and Cefepime Day 3 for suspected HCAP. Patient is afebrile, WBC wnl and cultures have reported no growth. Vancomycin trough drawn this evening is therapeutic at 15.869mcg/ml - will continue current regimen and  follow-up antibiotic plans. - SCr 1.64 - trending up, monitor closely  Goal of Therapy:  Vancomycin trough level 15-20 mcg/ml  Plan:  1. Continue Vancomycin 750mg  IV q24h 2. Continue Cefepime 1g IV q24h 3. Monitor renal function, cultures, clinical course and adjust as indicated  Cleon DewDulaney, [place] [facility]  [phone] 10/24/2014,7:19 PM

## 2014-10-24 NOTE — Progress Notes (Signed)
Pt with HR in 130's, O2 sats low 90's on bipap and RR mid 30's. Dr. Butler Denmarkizwan made aware. No new orders received at this time. Will continue to monitor. Asher Muir- Greidy Sherard,RN

## 2014-10-24 NOTE — Progress Notes (Addendum)
PULMONARY / CRITICAL CARE MEDICINE   Name: Larry RouxRobert D Underwood MRN: [id number] DOB: 1909/09/20    ADMISSION DATE:  07-08-14 CONSULTATION DATE:  10/23/14 --   REFERRING MD :  Dr Butler Denmarkizwan, [facility]  CHIEF COMPLAINT:  Acute respiratory failure  INITIAL PRESENTATION: 80 yo man, hx A Fib, diastolic dysfxn, CKD, admitted with resp distress and suspected RUL HCAP on 12/17, associated sepsis picture. Has received volume resuscitation. 12/18 more respiratory distress requiring BiPAP. Has not improved, increasing WOB, PCCM consulted 12/19.   STUDIES:   SIGNIFICANT EVENTS: BiPAP started 12/18  HISTORY OF PRESENT ILLNESS:  Larry Underwood. Per notes he has experienced a functional decline over the last 8 months, has had some swallowing difficulty and has been treated for serial PNA's. [facility], fever, cough and started augmentin. Then was evaluated 12/17 and [facility]. He has been treated with vanco, cefepime, has required BiPAP. He has also received volume resuscitation for presumed sepsis. Has continued to require BiPAP. PCCM consulted to evaluate him on 12/19.   PAST MEDICAL HISTORY :   has a past medical history of CHF (congestive heart failure) (01/04/2010); Atrial fib/flutter, transient (10/10/2011); Hypertension (12/27/2009); Other abnormal blood chemistry (03/24/2012); Temporomandibular joint sounds on opening and/or closing the jaw (10/15/2011); Hypertrophy of prostate with urinary obstruction and other lower urinary tract symptoms (LUTS) (03/19/2011); Abnormality of gait (12/11/2010); Nocturia (12/11/2010); Unspecified urinary incontinence (12/11/2010); Unspecified hypothyroidism (06/05/2010); Chronic kidney disease, stage Underwood (mild) (06/05/2010); Shortness of breath (02/20/2010); Hearing loss (01/06/2010); Debility, unspecified (01/04/2010); Long term (current) use of anticoagulants  (01/04/2010); Other and unspecified hyperlipidemia (12/27/2009); Essential and other specified forms of tremor (12/27/2009); Senile cataract, unspecified (12/27/2009); Coronary atherosclerosis of native coronary artery (01/07/2003); Reflux esophagitis (01/07/2003); and Diaphragmatic hernia without mention of obstruction or gangrene (01/07/2003).  has past surgical history that includes Cardiac surgery; Appendectomy; Inguinal hernia repair (Right, 1940); and Inguinal hernia repair (Right, 1990). Prior to Admission medications   Medication Sig Start Date End Date Taking? Authorizing Provider  amoxicillin-clavulanate (AUGMENTIN) 875-125 MG per tablet Take 1 tablet by mouth 2 (two) times daily.   Yes Historical Provider, MD  CARTIA XT 120 MG 24 hr capsule TAKE 1 CAPSULE DAILY TO CONTROL HEART RHYTHM. 08/30/14  Yes Kimber RelicArthur G Green, MD  finasteride (PROSCAR) 5 MG tablet TAKE 1 TABLET BY MOUTH DAILY FOR PROSTATE 07/19/14  Yes Kimber RelicArthur G Green, MD  furosemide (LASIX) 20 MG tablet Take 20 mg by mouth daily.   Yes Historical Provider, MD  ipratropium-albuterol (DUONEB) 0.5-2.5 (3) MG/3ML SOLN Take 3 mLs by nebulization every 6 (six) hours as needed (Shortness of breath).   Yes Historical Provider, MD  Multiple Vitamins-Minerals (MULTIVITAMIN WITH MINERALS) tablet Take 1 tablet by mouth 2 (two) times daily.   Yes Historical Provider, MD  PRADAXA 75 MG CAPS capsule TAKE ONE CAPSULE BY MOUTH EVERY MORNING AND EVERY EVENING FOR ANTICOAGULATION 09/17/14  Yes Kimber RelicArthur G Green, MD  saccharomyces boulardii (FLORASTOR) 250 MG capsule Take 250 mg by mouth 2 (two) times daily.   Yes Historical Provider, MD  simvastatin (ZOCOR) 40 MG tablet TAKE 1 TABLET BY MOUTH EVERY DAY 05/27/14  Yes Kimber RelicArthur G Green, MD  acetaminophen (TYLENOL) 500 MG tablet Take 500 mg by mouth every 6 (six) hours as needed for mild pain or headache.     Historical Provider, MD  levothyroxine (SYNTHROID, LEVOTHROID) 75 MCG tablet 1  daily for thyroid  supplement Patient taking differently: Take 75 mcg by mouth daily before breakfast. 1 daily for thyroid supplement 05/11/13   Kimber RelicArthur G Green, MD   No Known Allergies  FAMILY HISTORY:  indicated that his mother is deceased. He indicated that his father is deceased. He indicated that both of his sisters are deceased. He indicated that his brother is deceased. He indicated that both of his daughters are alive. He indicated that his son is alive.  SOCIAL HISTORY:  reports that he has never smoked. He has never used smokeless tobacco. He reports that he drinks about 0.6 oz of alcohol per week. He reports that he does not use illicit drugs.  REVIEW OF SYSTEMS:  Pt is unable to give  SUBJECTIVE:  C/o being thirsty  VITAL SIGNS: Temp:  [97.7 F (36.5 C)-99.2 F (37.3 C)] 97.7 F (36.5 C) (12/20 0800) Pulse Rate:  [38-135] 131 (12/20 0934) Resp:  [23-55] 32 (12/20 0934) BP: (37-157)/(23-80) 131/51 mmHg (12/20 0800) SpO2:  [90 %-100 %] 97 % (12/20 0934) FiO2 (%):  [40 %] 40 % (12/20 0729) Weight:  [62.5 kg (137 lb 12.6 oz)] 62.5 kg (137 lb 12.6 oz) (12/20 0500) HEMODYNAMICS:   VENTILATOR SETTINGS: Vent Mode:  [-]  FiO2 (%):  [40 %] 40 % INTAKE / OUTPUT:  Intake/Output Summary (Last 24 hours) at 10/24/14 1041 Last data filed at 10/24/14 0600  Gross per 24 hour  Intake    470 ml  Output   1275 ml  Net   -805 ml    PHYSICAL EXAMINATION: General:  Elderly man in some mild resp distress on BiPAP Neuro:  Awake, attempting to speak but mask makes communication difficult,  HEENT:  Op appears dry, no lesions, R eye with some redness, a lot of UA noise w ? secretions Cardiovascular:  Tachy, irregular Lungs:  B exp wheezes and some scattered crackles Abdomen:  benign Musculoskeletal:  No deformities Skin:  No rash or breakdown.   LABS:  CBC  Recent Labs Lab 10/22/14 0248 10/23/14 0250 10/24/14 0242  WBC 11.0* 7.7 8.0  HGB 10.5* 11.1* 10.9*  HCT 32.5* 33.1* 33.4*  PLT 203 142*  142*   Coag's No results for input(s): APTT, INR in the last 168 hours. BMET  Recent Labs Lab 10/22/14 0248 10/23/14 0250 10/24/14 0242  NA 137 138 141  K 4.6 3.9 3.6*  CL 102 100 101  CO2 20 21 27   BUN 32* 40* 56*  CREATININE 1.51* 1.34 1.64*  GLUCOSE 131* 114* 150*   Electrolytes  Recent Labs Lab 10/22/14 0248 10/23/14 0250 10/24/14 0242  CALCIUM 8.1* 8.5 8.2*  MG 1.8  --   --    Sepsis Markers  Recent Labs Lab 11-02-2014 1739 11-02-2014 1819 11-02-2014 2115  LATICACIDVEN 4.8* 4.90* 3.87*   ABG No results for input(s): PHART, PCO2ART, PO2ART in the last 168 hours. Liver Enzymes  Recent Labs Lab 11-02-2014 1739  AST 47*  ALT 22  ALKPHOS 84  BILITOT 0.8  ALBUMIN 2.9*   Cardiac Enzymes  Recent Labs Lab 11-02-2014 1739 10/22/14 1224 10/23/14 0928  TROPONINI  --  0.81* 1.05*  PROBNP 8315.0*  --   --    Glucose No results for input(s): GLUCAP in the last 168 hours.  Imaging Dg Chest Port 1 View  10/23/2014   CLINICAL DATA:  Sepsis and shortness of breath.  EXAM: PORTABLE CHEST - 1 VIEW  COMPARISON:  08/07/14  FINDINGS: Patchy densities in the right lung  could be related to atelectasis but nonspecific. Few densities at the left lung base are unchanged. Left upper lung is clear. Heart and mediastinum are stable.  IMPRESSION: Patchy densities in the right lung are nonspecific. No large areas of consolidation. Findings could be related to atelectasis. Atypical infection cannot be excluded.   Electronically Signed   By: Richarda Overlie M.D.   On: 10/23/2014 10:02     ASSESSMENT / PLAN:  PULMONARY A: Acute respiratory failure, possibly due to RUL PNA (although infiltrate is extremely subtle on 12/17 CXR) vs decompensated heart failure or primary cardiac cause.  P:   - agree with BiPAP as he can tolerate, attempt to provide him w breaks - agree with abx for presumed R HCAP - scheduled his albuterol / ipratropium nebs for now - continue prn NTS  - careful with  aggressive fluids as he is high risk for cardiogenic pulm edema; he may require some diuresis if BP will tolerate - Note he has been functional at baseline, desires aggressive care. Even so he is a very poor candidate for MV given his age and comorbidities. I would not recommend intubation or mechanical ventilation for this patient as I do not believe he would survive it. - trial single dose morphine to see if this helps with his WOB on BiPAP  CARDIOVASCULAR A: A Fib Diastolic CHF HTN P:  - note lasix given last pm 12/19 - dilt scheduled - pradaxa  RENAL A:  CKD P:   - follow BMP - careful diuresis if he can tolerate  GASTROINTESTINAL A:  NPO P:   - aspiration precautions  INFECTIOUS A:  R HCAP P:   BCx2 12/17 >>  UC 12/17 >> negative RSV 12/17 >>   vanco 12/18 >>  Cefepime 12/18 >>   ENDOCRINE A:  Hypothyroidism    P:   - synthroid ordered  NEUROLOGIC A:  Toxic metabolic encephalopathy P:   RASS goal: 0 - careful with sedating meds   FAMILY  - Updates: spoke with pt's daughter Waynetta Sandy at bedside, pt's son Nadine Counts by phone on 12/19.. I have supported full medical care and BiPAP as he requires and can tolerate. NTS as well as increased BD's. I have advised against intubation and MV because I do not believe Larry Underwood would survive these. His children understand this and agree.  Pt's son Nadine Counts, [phone] (c), [phone] (h), [phone] (w).   - Inter-disciplinary family meet or Palliative Care meeting due by:  12/26   TODAY'S SUMMARY:   Independent CC time 30 minutes  Levy Pupa, MD, PhD 10/24/2014, 10:41 AM [facility] [phone] or if no answer [phone]

## 2014-10-24 NOTE — Progress Notes (Signed)
[facility] Progress Note   Larry RouxRobert D Escue ZOX:[id number] DOB: 21-Aug-1909 DOA: July 13, 2014 PCP: Kimber RelicGREEN, ARTHUR G, MD  Brief narrative: Larry Underwood is a [age] y.o. male with a past medical history of diastolic heart failure, atrial flutter on pradaxa, hypertension, CKD3 and prior pneumonia who presents from skilled nursing facility due to respiratory distress and is found to have a right upper lobe infiltrate, fever of 102.9, WBC count of 11.3, lactic acidosis, mildly elevated troponin at 0.19 and mild increase in creatinine from baseline.   Subjective: Mildly tachypneic on BiPAP this AM. HR was controlled. Per RN, after being given morning meds, his respiratory status worsened and HR increased and he was placed back on the BiPAP.  Assessment/Plan: Principal Problem:   Sepsis due to pneumonia -Continue vancomycin and cefepime-note MRSA screen is positive -Added florastor -Pulmonary consult appreciated- per Pulm, he is not a candidate for intubation and after their discussion with the patient's family, he is now a DNR - Continue BiPAP until respiratory rate improves- - appears that he may be aspirating (per history from RN) therefore will be changing all of his oral meds to IV.  -Follow up blood cultures and respiratory virus panel-  influenza PCR pending?- may not have been done?  Active Problems:  Atrial flutter -Due to respiratory distress and possibly due to diuretics overnight -Placed on Cardizem infusion and loaded with Dig- additional dose to be given today -Cardiology asked to assist with management -Change Pradaxa to Lovenox  Elevated troponin I -Not a cardiac cath candidate-elevation likely secondary to acute distress    AKI on CKD (chronic kidney disease), stage III -Improved but Cr increased again this AM likely due to uncontrolled HR and diuretics from the night before    Chronic diastolic heart failure - Lasix on hold    Hypothyroidism -Continue  Synthroid  BPH - hold finasteride due to aspiration   Code Status: DNR Family Communication: With son Disposition Plan: To be determined DVT prophylaxis: full dose Lovenox Consultants: none  Procedures: none  Antibiotics: Anti-infectives    Start     Dose/Rate Route Frequency Ordered Stop   10/22/14 1800  ceFEPIme (MAXIPIME) 1 g in dextrose 5 % 50 mL IVPB     1 g100 mL/hr over 30 Minutes Intravenous Every 24 hours 03-27-2014 1853     10/22/14 1800  vancomycin (VANCOCIN) IVPB 750 mg/150 ml premix     750 mg150 mL/hr over 60 Minutes Intravenous Every 24 hours 03-27-2014 1853     03-27-2014 1815  ceFEPIme (MAXIPIME) 2 g in dextrose 5 % 50 mL IVPB     2 g100 mL/hr over 30 Minutes Intravenous  Once 03-27-2014 1802 03-27-2014 1852   03-27-2014 1815  vancomycin (VANCOCIN) IVPB 1000 mg/200 mL premix     1,000 mg200 mL/hr over 60 Minutes Intravenous  Once 03-27-2014 1802 03-27-2014 1930         Objective: Filed Weights   10/22/14 0427 10/23/14 0500 10/24/14 0500  Weight: 64.4 kg (141 lb 15.6 oz) 62.2 kg (137 lb 2 oz) 62.5 kg (137 lb 12.6 oz)    Intake/Output Summary (Last 24 hours) at 10/24/14 1208 Last data filed at 10/24/14 0600  Gross per 24 hour  Intake    470 ml  Output   1275 ml  Net   -805 ml     Vitals Filed Vitals:   10/24/14 1000 10/24/14 1050 10/24/14 1054 10/24/14 1100  BP: 120/50   119/63  Pulse: 121 117  66  Temp:      TempSrc:      Resp: 29 53  33  Height:      Weight:      SpO2: 92% 92% 94% 97%    Exam: General: Awake alert oriented 3, mild amount of resp distress with tachypnea Lungs: Bilateral rhonchi, no crackles or wheezing Cardiovascular: Regular rate and rhythm -without murmur gallop or rub normal S1 and S2 Abdomen: Nontender, nondistended, soft, bowel sounds positive, no rebound, no ascites, no appreciable mass Extremities: No significant cyanosis, clubbing, or edema bilateral lower extremities  Data Reviewed: Basic Metabolic Panel:  Recent  Labs Lab 10/30/2014 1739 10/22/14 0248 10/23/14 0250 10/24/14 0242  NA 133* 137 138 141  K 4.3 4.6 3.9 3.6*  CL 97 102 100 101  CO2 19 20 21 27   GLUCOSE 201* 131* 114* 150*  BUN 30* 32* 40* 56*  CREATININE 1.47* 1.51* 1.34 1.64*  CALCIUM 8.2* 8.1* 8.5 8.2*  MG  --  1.8  --   --    Liver Function Tests:  Recent Labs Lab 11/01/2014 1739  AST 47*  ALT 22  ALKPHOS 84  BILITOT 0.8  PROT 6.7  ALBUMIN 2.9*   No results for input(s): LIPASE, AMYLASE in the last 168 hours. No results for input(s): AMMONIA in the last 168 hours. CBC:  Recent Labs Lab 11/04/2014 1739 10/22/14 0248 10/23/14 0250 10/24/14 0242  WBC 11.7* 11.0* 7.7 8.0  NEUTROABS 10.8*  --   --   --   HGB 11.4* 10.5* 11.1* 10.9*  HCT 35.4* 32.5* 33.1* 33.4*  MCV 91.0 91.0 90.7 92.0  PLT 149* 203 142* 142*   Cardiac Enzymes:  Recent Labs Lab 10/22/14 1224 10/23/14 0928  TROPONINI 0.81* 1.05*   BNP (last 3 results)  Recent Labs  10/28/2014 1739  PROBNP 8315.0*   CBG: No results for input(s): GLUCAP in the last 168 hours.  Recent Results (from the past 240 hour(s))  Blood Culture (routine x 2)     Status: None (Preliminary result)   Collection Time: 10/29/2014  6:10 PM  Result Value Ref Range Status   Specimen Description BLOOD ARM RIGHT  Final   Special Requests BOTTLES DRAWN AEROBIC AND ANAEROBIC 5CC  Final   Culture  Setup Time   Final    10/22/2014 01:21 Performed at [facility]    Culture   Final           BLOOD CULTURE RECEIVED NO GROWTH TO DATE CULTURE WILL BE HELD FOR 5 DAYS BEFORE ISSUING A FINAL NEGATIVE REPORT Performed at [facility]    Report Status PENDING  Incomplete  Blood Culture (routine x 2)     Status: None (Preliminary result)   Collection Time: 10/20/2014  6:20 PM  Result Value Ref Range Status   Specimen Description BLOOD ARM LEFT  Final   Special Requests BOTTLES DRAWN AEROBIC AND ANAEROBIC 5CC  Final   Culture  Setup Time   Final    10/22/2014  01:21 Performed at [facility]    Culture   Final           BLOOD CULTURE RECEIVED NO GROWTH TO DATE CULTURE WILL BE HELD FOR 5 DAYS BEFORE ISSUING A FINAL NEGATIVE REPORT Performed at [facility]    Report Status PENDING  Incomplete  Urine culture     Status: None   Collection Time: 11/01/2014  6:26 PM  Result Value Ref Range Status   Specimen Description URINE, CATHETERIZED  Final   Special Requests NONE  Final   Culture  Setup Time   Final    10/22/2014 01:21 Performed at [facility]    Colony Count NO GROWTH Performed at [facility]   Final   Culture NO GROWTH Performed at [facility]   Final   Report Status 10/22/2014 FINAL  Final  MRSA PCR Screening     Status: Abnormal   Collection Time: 10/22/14 12:06 AM  Result Value Ref Range Status   MRSA by PCR POSITIVE (A) NEGATIVE Final    Comment:        The GeneXpert MRSA Assay (FDA approved for NASAL specimens only), is one component of a comprehensive MRSA colonization surveillance program. It is not intended to diagnose MRSA infection nor to guide or monitor treatment for MRSA infections. RESULT CALLED TO, READ BACK BY AND VERIFIED WITH: Louanne Skye [phone] WILDERK      Studies:  Recent x-ray studies have been reviewed in detail by the Attending Physician  Scheduled Meds:  Scheduled Meds: . antiseptic oral rinse  15 mL Mouth Rinse 6 times per day  . ceFEPime (MAXIPIME) IV  1 g Intravenous Q24H  . Chlorhexidine Gluconate Cloth  6 each Topical Q0600  . digoxin  0.25 mg Intravenous Once  . ipratropium-albuterol  3 mL Nebulization QID  . levothyroxine  37.5 mcg Intravenous Daily  . mupirocin ointment  1 application Nasal BID  . potassium chloride  40 mEq Oral Q4H  . sodium chloride  3 mL Intravenous Q12H  . sodium chloride  3 mL Intravenous Q12H  . vancomycin  750 mg Intravenous Q24H   Continuous Infusions: . diltiazem (CARDIZEM) infusion 15 mg/hr (10/24/14  0236)    Time spent on care of this patient: 35 min   Valincia Touch, MD 10/24/2014, 12:08 PM  LOS: 3 days   [facility] Office  [phone] Pager - Text Page per [email or web address]  If 7PM-7AM, please contact night-coverage [email or web address]

## 2014-10-24 NOTE — Progress Notes (Signed)
ANTICOAGULATION CONSULT NOTE - Initial Consult  Pharmacy Consult for Lovenox Indication: atrial fibrillation  No Known Allergies  Patient Measurements: Height: 5\' 8"  (172.7 cm) Weight: 137 lb 12.6 oz (62.5 kg) IBW/kg (Calculated) : 68.4  Vital Signs: Temp: 97.7 F (36.5 C) (12/20 0800) Temp Source: Axillary (12/20 0400) BP: 119/63 mmHg (12/20 1100) Pulse Rate: 66 (12/20 1100)  Labs:  Recent Labs  10/22/14 0248 10/22/14 1224 10/23/14 0250 10/23/14 0928 10/24/14 0242  HGB 10.5*  --  11.1*  --  10.9*  HCT 32.5*  --  33.1*  --  33.4*  PLT 203  --  142*  --  142*  CREATININE 1.51*  --  1.34  --  1.64*  TROPONINI  --  0.81*  --  1.05*  --     Estimated Creatinine Clearance: 19.6 mL/min (by C-G formula based on Cr of 1.64).   Medical History: Past Medical History  Diagnosis Date  . CHF (congestive heart failure) 01/04/2010  . Atrial fib/flutter, transient 10/10/2011  . Hypertension 12/27/2009  . Other abnormal blood chemistry 03/24/2012    Hyperglycemia  . Temporomandibular joint sounds on opening and/or closing the jaw 10/15/2011  . Hypertrophy of prostate with urinary obstruction and other lower urinary tract symptoms (LUTS) 03/19/2011  . Abnormality of gait 12/11/2010  . Nocturia 12/11/2010  . Unspecified urinary incontinence 12/11/2010  . Unspecified hypothyroidism 06/05/2010  . Chronic kidney disease, stage II (mild) 06/05/2010  . Shortness of breath 02/20/2010  . Hearing loss 01/06/2010  . Debility, unspecified 01/04/2010  . Long term (current) use of anticoagulants 01/04/2010  . Other and unspecified hyperlipidemia 12/27/2009  . Essential and other specified forms of tremor 12/27/2009  . Senile cataract, unspecified 12/27/2009  . Coronary atherosclerosis of native coronary artery 01/07/2003  . Reflux esophagitis 01/07/2003  . Diaphragmatic hernia without mention of obstruction or gangrene 01/07/2003    Medications:  Scheduled:  . antiseptic oral rinse   15 mL Mouth Rinse 6 times per day  . ceFEPime (MAXIPIME) IV  1 g Intravenous Q24H  . Chlorhexidine Gluconate Cloth  6 each Topical Q0600  . digoxin  0.25 mg Intravenous Once  . [START ON 10/25/2014] enoxaparin (LOVENOX) injection  65 mg Subcutaneous Q24H  . ipratropium-albuterol  3 mL Nebulization QID  . levothyroxine  37.5 mcg Intravenous Daily  . mupirocin ointment  1 application Nasal BID  . potassium chloride  40 mEq Oral Q4H  . sodium chloride  3 mL Intravenous Q12H  . sodium chloride  3 mL Intravenous Q12H  . vancomycin  750 mg Intravenous Q24H   Infusions:  . diltiazem (CARDIZEM) infusion 15 mg/hr (10/24/14 0236)    Assessment: [age] yo M presented to ED on 12/17 w/ SOB without improvement with breathing treatments. Patient is on Pradaxa PTA for PAF. Will switch to lovenox today to prevent aspiration since patient is continuing to struggle with respiration. H/H and plt remain low but stable with no reported s/s bleeding. SCr trend up today with estimated CrCl ~19 ml/min. Last dose of Pradaxa was this am.   Goal of Therapy:  Anti-Xa level 0.6-1 units/ml 4hrs after LMWH dose given Monitor platelets by anticoagulation protocol: Yes   Plan:  - D/c Pradaxa - Start Lovenox 65 mg SQ q24h (1 mg/kg q24h) tomorrow am about 24 hours after last Pradaxa dose - Monitor CBC, s/s bleeding  Aydrian Halpin K. Bonnye FavaNicolsen, PharmD Clinical Pharmacist - Resident Pager: [phone] Pharmacy: [phone] 10/24/2014 12:20 PM

## 2014-10-24 NOTE — Progress Notes (Signed)
Called to bedside by RN d/t pt w/ resp distress.  Noted pt to have increased WOB and desat to 82-84%.  Immed placed pt back on bipap.  Pt tol well, pt appears to be slowly improving on bipap. Sat increased to 94-96%.  RN at bedside.

## 2014-10-25 DIAGNOSIS — R0603 Acute respiratory distress: Secondary | ICD-10-CM | POA: Insufficient documentation

## 2014-10-25 DIAGNOSIS — I359 Nonrheumatic aortic valve disorder, unspecified: Secondary | ICD-10-CM

## 2014-10-25 MED ORDER — SODIUM CHLORIDE 0.9 % IV SOLN
INTRAVENOUS | Status: DC
  Administered 2014-10-25: 14:00:00 via INTRAVENOUS

## 2014-10-25 MED ORDER — DEXTROSE 5 % IV SOLN
500.0000 mg | INTRAVENOUS | Status: DC
  Administered 2014-10-25 – 2014-10-26 (×2): 500 mg via INTRAVENOUS
  Filled 2014-10-25 (×3): qty 500

## 2014-10-25 MED ORDER — MAGNESIUM SULFATE 2 GM/50ML IV SOLN
2.0000 g | Freq: Once | INTRAVENOUS | Status: AC
  Administered 2014-10-25: 2 g via INTRAVENOUS
  Filled 2014-10-25: qty 50

## 2014-10-25 MED ORDER — POTASSIUM CHLORIDE 10 MEQ/100ML IV SOLN
10.0000 meq | INTRAVENOUS | Status: AC
  Administered 2014-10-25 (×4): 10 meq via INTRAVENOUS
  Filled 2014-10-25 (×4): qty 100

## 2014-10-25 MED ORDER — SODIUM CHLORIDE 0.9 % IV SOLN
INTRAVENOUS | Status: DC
  Administered 2014-10-25: 10 mL/h via INTRAVENOUS

## 2014-10-25 MED ORDER — DIGOXIN 0.25 MG/ML IJ SOLN
0.0625 mg | Freq: Every day | INTRAMUSCULAR | Status: DC
  Administered 2014-10-25 – 2014-10-27 (×3): 0.0625 mg via INTRAVENOUS
  Filled 2014-10-25 (×5): qty 0.5

## 2014-10-25 MED ORDER — ENOXAPARIN SODIUM 60 MG/0.6ML ~~LOC~~ SOLN
60.0000 mg | SUBCUTANEOUS | Status: DC
  Administered 2014-10-26 – 2014-10-27 (×2): 60 mg via SUBCUTANEOUS
  Filled 2014-10-25 (×3): qty 0.6

## 2014-10-25 NOTE — Progress Notes (Signed)
  Echocardiogram 2D Echocardiogram has been performed.  Arvil ChacoFoster, Deric Bocock 10/25/2014, 9:19 AM

## 2014-10-25 NOTE — Progress Notes (Signed)
[facility] Progress Note   KAELOB PERSKY FAO:[id number] DOB: 08-Dec-1908 DOA: 11/06/2014 PCP: Kimber Relic, MD  Brief narrative: Larry Underwood is a 80 y.o. male with a past medical history of diastolic heart failure, atrial flutter on pradaxa, hypertension, CKD3 and prior pneumonia who presents from skilled nursing facility due to respiratory distress and is found to have a right upper lobe infiltrate, fever of 102.9, WBC count of 11.3, lactic acidosis, mildly elevated troponin at 0.19 and mild increase in creatinine from baseline.   Subjective: Continues to be tachypneic on BiPAP.  Assessment/Plan: Principal Problem:   Sepsis due to pneumonia -Continue vancomycin and cefepime-note MRSA screen is positive -Added florastor -Pulmonary consult appreciated- per Pulm, he is not a candidate for intubation and after their discussion with the patient's family, he is now a DNR - Continue BiPAP until respiratory rate improves- - appears that he may be aspirating (per history from RN) therefore, have changed all of his oral meds to IV.  -Follow up blood cultures and respiratory virus panel-  influenza PCR pending?- may not have been done?  Active Problems:  Atrial flutter -Due to respiratory distress and possibly due to diuretics overnight -Placed on Cardizem infusion and loaded with Dig- now bradycardic with 4:1 conduction -Cardiology  assisting with management -Changed Pradaxa to Lovenox  Elevated troponin I -Not a cardiac cath candidate-elevation likely secondary to acute distress    AKI on CKD (chronic kidney disease), stage III -Improved but Cr increased again 12/20 likely due to uncontrolled HR and diuretics from the night before    Chronic diastolic heart failure - Lasix on hold    Hypothyroidism -Continue Synthroid  BPH - hold finasteride due to aspiration   Code Status: DNR Family Communication: With son Disposition Plan: To be determined- pulm and  cardio recommended to consider comfort care- have discussed with son- will discuss with wife prior to calling them- unable to reach wife or leave a message at this time DVT prophylaxis: full dose Lovenox Consultants: none  Procedures: none  Antibiotics: Anti-infectives    Start     Dose/Rate Route Frequency Ordered Stop   10/22/14 1800  ceFEPIme (MAXIPIME) 1 g in dextrose 5 % 50 mL IVPB     1 g100 mL/hr over 30 Minutes Intravenous Every 24 hours 11-06-14 1853     10/22/14 1800  vancomycin (VANCOCIN) IVPB 750 mg/150 ml premix     750 mg150 mL/hr over 60 Minutes Intravenous Every 24 hours 11/06/14 1853     06-Nov-2014 1815  ceFEPIme (MAXIPIME) 2 g in dextrose 5 % 50 mL IVPB     2 g100 mL/hr over 30 Minutes Intravenous  Once 06-Nov-2014 1802 11-06-14 1852   11/06/14 1815  vancomycin (VANCOCIN) IVPB 1000 mg/200 mL premix     1,000 mg200 mL/hr over 60 Minutes Intravenous  Once 06-Nov-2014 1802 11/06/2014 1930      Objective: Filed Weights   10/23/14 0500 10/24/14 0500 10/25/14 0500  Weight: 62.2 kg (137 lb 2 oz) 62.5 kg (137 lb 12.6 oz) 61.5 kg (135 lb 9.3 oz)    Intake/Output Summary (Last 24 hours) at 10/25/14 1431 Last data filed at 10/25/14 0900  Gross per 24 hour  Intake    540 ml  Output    250 ml  Net    290 ml     Vitals Filed Vitals:   10/25/14 1200 10/25/14 1230 10/25/14 1238 10/25/14 1300  BP: 136/47 136/47  138/51  Pulse: 44 67  44  Temp: 98.2 F (36.8 C)     TempSrc: Axillary     Resp: 35 29  40  Height:      Weight:      SpO2: 97% 98% 98% 97%    Exam: General: Awake alert oriented 3, continues to have resp distress with tachypnea Lungs: Bilateral rhonchi, no crackles or wheezing Cardiovascular: Regular rate and rhythm -without murmur gallop or rub normal S1 and S2 Abdomen: Nontender, nondistended, soft, bowel sounds positive, no rebound, no ascites, no appreciable mass Extremities: No significant cyanosis, clubbing, or edema bilateral lower extremities  Data  Reviewed: Basic Metabolic Panel:  Recent Labs Lab December 13, 2013 1739 10/22/14 0248 10/23/14 0250 10/24/14 0242  NA 133* 137 138 141  K 4.3 4.6 3.9 3.6*  CL 97 102 100 101  CO2 19 20 21 27   GLUCOSE 201* 131* 114* 150*  BUN 30* 32* 40* 56*  CREATININE 1.47* 1.51* 1.34 1.64*  CALCIUM 8.2* 8.1* 8.5 8.2*  MG  --  1.8  --   --    Liver Function Tests:  Recent Labs Lab December 13, 2013 1739  AST 47*  ALT 22  ALKPHOS 84  BILITOT 0.8  PROT 6.7  ALBUMIN 2.9*   No results for input(s): LIPASE, AMYLASE in the last 168 hours. No results for input(s): AMMONIA in the last 168 hours. CBC:  Recent Labs Lab December 13, 2013 1739 10/22/14 0248 10/23/14 0250 10/24/14 0242  WBC 11.7* 11.0* 7.7 8.0  NEUTROABS 10.8*  --   --   --   HGB 11.4* 10.5* 11.1* 10.9*  HCT 35.4* 32.5* 33.1* 33.4*  MCV 91.0 91.0 90.7 92.0  PLT 149* 203 142* 142*   Cardiac Enzymes:  Recent Labs Lab 10/22/14 1224 10/23/14 0928  TROPONINI 0.81* 1.05*   BNP (last 3 results)  Recent Labs  December 13, 2013 1739  PROBNP 8315.0*   CBG: No results for input(s): GLUCAP in the last 168 hours.  Recent Results (from the past 240 hour(s))  Blood Culture (routine x 2)     Status: None (Preliminary result)   Collection Time: December 13, 2013  6:10 PM  Result Value Ref Range Status   Specimen Description BLOOD ARM RIGHT  Final   Special Requests BOTTLES DRAWN AEROBIC AND ANAEROBIC 5CC  Final   Culture  Setup Time   Final    10/22/2014 01:21 Performed at [facility]    Culture   Final           BLOOD CULTURE RECEIVED NO GROWTH TO DATE CULTURE WILL BE HELD FOR 5 DAYS BEFORE ISSUING A FINAL NEGATIVE REPORT Performed at [facility]    Report Status PENDING  Incomplete  Blood Culture (routine x 2)     Status: None (Preliminary result)   Collection Time: December 13, 2013  6:20 PM  Result Value Ref Range Status   Specimen Description BLOOD ARM LEFT  Final   Special Requests BOTTLES DRAWN AEROBIC AND ANAEROBIC 5CC  Final   Culture   Setup Time   Final    10/22/2014 01:21 Performed at [facility]    Culture   Final           BLOOD CULTURE RECEIVED NO GROWTH TO DATE CULTURE WILL BE HELD FOR 5 DAYS BEFORE ISSUING A FINAL NEGATIVE REPORT Performed at [facility]    Report Status PENDING  Incomplete  Urine culture     Status: None   Collection Time: December 13, 2013  6:26 PM  Result Value Ref Range Status   Specimen  Description URINE, CATHETERIZED  Final   Special Requests NONE  Final   Culture  Setup Time   Final    10/22/2014 01:21 Performed at [facility]    Colony Count NO GROWTH Performed at [facility]   Final   Culture NO GROWTH Performed at [facility]   Final   Report Status 10/22/2014 FINAL  Final  MRSA PCR Screening     Status: Abnormal   Collection Time: 10/22/14 12:06 AM  Result Value Ref Range Status   MRSA by PCR POSITIVE (A) NEGATIVE Final    Comment:        The GeneXpert MRSA Assay (FDA approved for NASAL specimens only), is one component of a comprehensive MRSA colonization surveillance program. It is not intended to diagnose MRSA infection nor to guide or monitor treatment for MRSA infections. RESULT CALLED TO, READ BACK BY AND VERIFIED WITH: Louanne Skye TOMLINSON,RN [phone] WILDERK      Studies:  Recent x-ray studies have been reviewed in detail by the Attending Physician  Scheduled Meds:  Scheduled Meds: . antiseptic oral rinse  15 mL Mouth Rinse 6 times per day  . ceFEPime (MAXIPIME) IV  1 g Intravenous Q24H  . Chlorhexidine Gluconate Cloth  6 each Topical Q0600  . digoxin  0.0625 mg Intravenous Daily  . [START ON 10/26/2014] enoxaparin (LOVENOX) injection  60 mg Subcutaneous Q24H  . ipratropium-albuterol  3 mL Nebulization QID  . levothyroxine  37.5 mcg Intravenous Daily  . mupirocin ointment  1 application Nasal BID  . potassium chloride  10 mEq Intravenous Q1 Hr x 4  . sodium chloride  3 mL Intravenous Q12H  . sodium chloride  3 mL  Intravenous Q12H  . vancomycin  750 mg Intravenous Q24H   Continuous Infusions: . sodium chloride 10 mL/hr at 10/25/14 1333  . diltiazem (CARDIZEM) infusion 15 mg/hr (10/25/14 1151)    Time spent on care of this patient: 35 min   Meisha Salone, MD 10/25/2014, 2:31 PM  LOS: 4 days   [facility] Office  [phone] Pager - Text Page per [email or web address]  If 7PM-7AM, please contact night-coverage [email or web address]

## 2014-10-25 NOTE — Progress Notes (Signed)
DAILY PROGRESS NOTE  Subjective:  Remains tachycardic, tachypneic on bipap. Rate is decreased - now seeing 4:1 conduction at times - has been digitalized. Mildly hypokalemic today.  Objective:  Temp:  [98 F (36.7 C)-99.5 F (37.5 C)] 98 F (36.7 C) (12/21 0800) Pulse Rate:  [42-70] 64 (12/21 0900) Resp:  [21-45] 35 (12/21 0900) BP: (99-141)/(34-74) 125/53 mmHg (12/21 0900) SpO2:  [94 %-98 %] 96 % (12/21 0900) FiO2 (%):  [40 %] 40 % (12/21 0822) Weight:  [135 lb 9.3 oz (61.5 kg)] 135 lb 9.3 oz (61.5 kg) (12/21 0500) Weight change: -2 lb 3.3 oz (-1 kg)  Intake/Output from previous day: 12/20 0701 - 12/21 0700 In: 510 [I.V.:360; IV Piggyback:150] Out: 150 [Urine:150]  Intake/Output from this shift: Total I/O In: 30 [I.V.:30] Out: 100 [Urine:100]  Medications: Current Facility-Administered Medications  Medication Dose Route Frequency Provider Last Rate Last Dose  . 0.9 %  sodium chloride infusion  250 mL Intravenous PRN Debbe Odea, MD      . acetaminophen (TYLENOL) suppository 650 mg  650 mg Rectal Q6H PRN Alphia Moh, MD      . albuterol (PROVENTIL) (2.5 MG/3ML) 0.083% nebulizer solution 2.5 mg  2.5 mg Nebulization Q4H PRN Collene Gobble, MD      . antiseptic oral rinse (BIOTENE) solution 15 mL  15 mL Mouth Rinse 6 times per day Alphia Moh, MD   15 mL at 10/25/14 0800  . ceFEPIme (MAXIPIME) 1 g in dextrose 5 % 50 mL IVPB  1 g Intravenous Q24H Shaune Pollack, MD   1 g at 10/24/14 1739  . Chlorhexidine Gluconate Cloth 2 % PADS 6 each  6 each Topical Q0600 Alphia Moh, MD   6 each at 10/24/14 0458  . digoxin (LANOXIN) 0.25 MG/ML injection 0.0625 mg  0.0625 mg Intravenous Daily Pixie Casino, MD      . diltiazem (CARDIZEM) 100 mg in dextrose 5 % 100 mL (1 mg/mL) infusion  5-15 mg/hr Intravenous Titrated Debbe Odea, MD 15 mL/hr at 10/25/14 0900 15 mg/hr at 10/25/14 0900  . [START ON 10/26/2014] enoxaparin (LOVENOX) injection 60 mg  60 mg Subcutaneous Q24H  Benjamine Sprague [place], [facility]      . ipratropium-albuterol (DUONEB) 0.5-2.5 (3) MG/3ML nebulizer solution 3 mL  3 mL Nebulization QID Collene Gobble, MD   3 mL at 10/25/14 8185  . levothyroxine (SYNTHROID, LEVOTHROID) injection 37.5 mcg  37.5 mcg Intravenous Daily Debbe Odea, MD   37.5 mcg at 10/25/14 0933  . mupirocin ointment (BACTROBAN) 2 % 1 application  1 application Nasal BID Alphia Moh, MD   1 application at 63/14/97 0932  . sodium chloride 0.9 % injection 3 mL  3 mL Intravenous Q12H Alphia Moh, MD   3 mL at 10/25/14 0947  . sodium chloride 0.9 % injection 3 mL  3 mL Intravenous Q12H Alphia Moh, MD   3 mL at 10/24/14 2202  . sodium chloride 0.9 % injection 3 mL  3 mL Intravenous PRN Alphia Moh, MD      . vancomycin (VANCOCIN) IVPB 750 mg/150 ml premix  750 mg Intravenous Q24H Shaune Pollack, MD   750 mg at 10/24/14 1739    Physical Exam: General appearance: alert and moderate distress Lungs: diminished breath sounds bilaterally, rales bilaterally and rhonchi bilaterally Heart: irregularly irregular rhythm and tachycardic Extremities: extremities normal, atraumatic, no cyanosis or edema Pulses: 2+ and symmetric  Lab Results: Results for orders placed or performed during the hospital  encounter of 10/25/2014 (from the past 48 hour(s))  Basic metabolic panel     Status: Abnormal   Collection Time: 10/24/14  2:42 AM  Result Value Ref Range   Sodium 141 137 - 147 mEq/L   Potassium 3.6 (L) 3.7 - 5.3 mEq/L   Chloride 101 96 - 112 mEq/L   CO2 27 19 - 32 mEq/L   Glucose, Bld 150 (H) 70 - 99 mg/dL   BUN 56 (H) 6 - 23 mg/dL   Creatinine, Ser 1.64 (H) 0.50 - 1.35 mg/dL   Calcium 8.2 (L) 8.4 - 10.5 mg/dL   GFR calc non Af Amer 32 (L) >90 mL/min   GFR calc Af Amer 37 (L) >90 mL/min    Comment: (NOTE) The eGFR has been calculated using the CKD EPI equation. This calculation has not been validated in all clinical situations. eGFR's persistently <90 mL/min signify possible  Chronic Kidney Disease.    Anion gap 13 5 - 15  CBC     Status: Abnormal   Collection Time: 10/24/14  2:42 AM  Result Value Ref Range   WBC 8.0 4.0 - 10.5 K/uL   RBC 3.63 (L) 4.22 - 5.81 MIL/uL   Hemoglobin 10.9 (L) 13.0 - 17.0 g/dL   HCT 33.4 (L) 39.0 - 52.0 %   MCV 92.0 78.0 - 100.0 fL   MCH 30.0 26.0 - 34.0 pg   MCHC 32.6 30.0 - 36.0 g/dL   RDW 14.4 11.5 - 15.5 %   Platelets 142 (L) 150 - 400 K/uL  Vancomycin, trough     Status: None   Collection Time: 10/24/14  5:20 PM  Result Value Ref Range   Vancomycin Tr 15.9 10.0 - 20.0 ug/mL    Imaging: No results found.  Assessment:  Principal Problem:   Sepsis due to pneumonia Active Problems:   Hypothyroidism   CKD (chronic kidney disease), stage III   Chronic diastolic heart failure   Atrial flutter   AKI (acute kidney injury)   Elevated troponin I level   HCAP (healthcare-associated pneumonia)   Plan:  Continues to struggle with respiration. Requiring BIPAP support. Now full DNR. Remains in a-fib with RVR, however with 4:1 conduction at times. Will start daily low dose digoxin 0.625 mg IV - BP improved. Would consider DNR discussion if he continues to be unable to wean off bipap - palliative care would be appropriate.  Replete magnesium and potassium today. Check Digoxin level in 3 days - I have ordered it.  Time Spent Directly with Patient:  15 minutes  Length of Stay:  LOS: 4 days   Pixie Casino, MD, [facility] Attending Cardiologist [facility]  HILTY,Kenneth C 10/25/2014, 11:48 AM

## 2014-10-25 NOTE — Progress Notes (Signed)
Pharmacy: Lovenox  89yom on pradaxa pta for afib, switched to therapeutic lovenox on admission as he is requiring bipap and unable to take PO. His weight has decreased since lovenox started - will adjust dose accordingly.   Plan: 1) Change lovenox to 60mg  sq q24 (1mg /kg q24) 2) Follow up BMET, CBC tomorrow  Louie CasaJennifer Gadge Hermiz, PharmD, BCPS 10/25/2014, 2:14 PM

## 2014-10-25 NOTE — Progress Notes (Signed)
PULMONARY / CRITICAL CARE MEDICINE   Name: Larry RouxRobert D Underwood MRN: [id number] DOB: 1909/09/20    ADMISSION DATE:  07-08-14 CONSULTATION DATE:  10/23/14 --   REFERRING MD :  Dr Butler Denmarkizwan, [facility]  CHIEF COMPLAINT:  Acute respiratory failure  INITIAL PRESENTATION: 80 yo man, hx A Fib, diastolic dysfxn, CKD, admitted with resp distress and suspected RUL HCAP on 12/17, associated sepsis picture. Has received volume resuscitation. 12/18 more respiratory distress requiring BiPAP. Has not improved, increasing WOB, PCCM consulted 12/19.   STUDIES:   SIGNIFICANT EVENTS: BiPAP started 12/18  HISTORY OF PRESENT ILLNESS:  Larry Underwood is a [age] yo man with h/o chronic diastolic CHF, AF, HTN, CKD stage II. Per notes he has experienced a functional decline over the last 8 months, has had some swallowing difficulty and has been treated for serial PNA's. [facility], fever, cough and started augmentin. Then was evaluated 12/17 and [facility]. He has been treated with vanco, cefepime, has required BiPAP. He has also received volume resuscitation for presumed sepsis. Has continued to require BiPAP. PCCM consulted to evaluate him on 12/19.   PAST MEDICAL HISTORY :   has a past medical history of CHF (congestive heart failure) (01/04/2010); Atrial fib/flutter, transient (10/10/2011); Hypertension (12/27/2009); Other abnormal blood chemistry (03/24/2012); Temporomandibular joint sounds on opening and/or closing the jaw (10/15/2011); Hypertrophy of prostate with urinary obstruction and other lower urinary tract symptoms (LUTS) (03/19/2011); Abnormality of gait (12/11/2010); Nocturia (12/11/2010); Unspecified urinary incontinence (12/11/2010); Unspecified hypothyroidism (06/05/2010); Chronic kidney disease, stage II (mild) (06/05/2010); Shortness of breath (02/20/2010); Hearing loss (01/06/2010); Debility, unspecified (01/04/2010); Long term (current) use of anticoagulants  (01/04/2010); Other and unspecified hyperlipidemia (12/27/2009); Essential and other specified forms of tremor (12/27/2009); Senile cataract, unspecified (12/27/2009); Coronary atherosclerosis of native coronary artery (01/07/2003); Reflux esophagitis (01/07/2003); and Diaphragmatic hernia without mention of obstruction or gangrene (01/07/2003).  has past surgical history that includes Cardiac surgery; Appendectomy; Inguinal hernia repair (Right, 1940); and Inguinal hernia repair (Right, 1990). Prior to Admission medications   Medication Sig Start Date End Date Taking? Authorizing Provider  amoxicillin-clavulanate (AUGMENTIN) 875-125 MG per tablet Take 1 tablet by mouth 2 (two) times daily.   Yes Historical Provider, MD  CARTIA XT 120 MG 24 hr capsule TAKE 1 CAPSULE DAILY TO CONTROL HEART RHYTHM. 08/30/14  Yes Kimber RelicArthur G Green, MD  finasteride (PROSCAR) 5 MG tablet TAKE 1 TABLET BY MOUTH DAILY FOR PROSTATE 07/19/14  Yes Kimber RelicArthur G Green, MD  furosemide (LASIX) 20 MG tablet Take 20 mg by mouth daily.   Yes Historical Provider, MD  ipratropium-albuterol (DUONEB) 0.5-2.5 (3) MG/3ML SOLN Take 3 mLs by nebulization every 6 (six) hours as needed (Shortness of breath).   Yes Historical Provider, MD  Multiple Vitamins-Minerals (MULTIVITAMIN WITH MINERALS) tablet Take 1 tablet by mouth 2 (two) times daily.   Yes Historical Provider, MD  PRADAXA 75 MG CAPS capsule TAKE ONE CAPSULE BY MOUTH EVERY MORNING AND EVERY EVENING FOR ANTICOAGULATION 09/17/14  Yes Kimber RelicArthur G Green, MD  saccharomyces boulardii (FLORASTOR) 250 MG capsule Take 250 mg by mouth 2 (two) times daily.   Yes Historical Provider, MD  simvastatin (ZOCOR) 40 MG tablet TAKE 1 TABLET BY MOUTH EVERY DAY 05/27/14  Yes Kimber RelicArthur G Green, MD  acetaminophen (TYLENOL) 500 MG tablet Take 500 mg by mouth every 6 (six) hours as needed for mild pain or headache.     Historical Provider, MD  levothyroxine (SYNTHROID, LEVOTHROID) 75 MCG tablet 1  daily for thyroid  supplement Patient taking differently: Take 75 mcg by mouth daily before breakfast. 1 daily for thyroid supplement 05/11/13   Kimber RelicArthur G Green, MD   No Known Allergies  FAMILY HISTORY:  indicated that his mother is deceased. He indicated that his father is deceased. He indicated that both of his sisters are deceased. He indicated that his brother is deceased. He indicated that both of his daughters are alive. He indicated that his son is alive.  SOCIAL HISTORY:  reports that he has never smoked. He has never used smokeless tobacco. He reports that he drinks about 0.6 oz of alcohol per week. He reports that he does not use illicit drugs.  REVIEW OF SYSTEMS:  Pt is unable to give  SUBJECTIVE:  C/o being thirsty  VITAL SIGNS: Temp:  [98 F (36.7 C)-99.5 F (37.5 C)] 98 F (36.7 C) (12/21 0800) Pulse Rate:  [42-70] 64 (12/21 0900) Resp:  [21-45] 35 (12/21 0900) BP: (99-141)/(34-74) 125/53 mmHg (12/21 0900) SpO2:  [94 %-98 %] 96 % (12/21 0900) FiO2 (%):  [40 %] 40 % (12/21 0822) Weight:  [61.5 kg (135 lb 9.3 oz)] 61.5 kg (135 lb 9.3 oz) (12/21 0500) HEMODYNAMICS:   VENTILATOR SETTINGS: Vent Mode:  [-]  FiO2 (%):  [40 %] 40 % INTAKE / OUTPUT:  Intake/Output Summary (Last 24 hours) at 10/25/14 1125 Last data filed at 10/25/14 0900  Gross per 24 hour  Intake    540 ml  Output    250 ml  Net    290 ml    PHYSICAL EXAMINATION: General:  Elderly man in some mild resp distress remains on BiPAP Neuro:  Awake, attempting to speak but mask makes communication difficult,  HEENT:  Op appears dry, no lesions, R eye with some redness, a lot of UA noise w ? secretions Cardiovascular:  Tachy, irregular Lungs:  B exp wheezes and some scattered crackles Abdomen:  benign Musculoskeletal:  No deformities Skin:  No rash or breakdown.   LABS:  CBC  Recent Labs Lab 10/22/14 0248 10/23/14 0250 10/24/14 0242  WBC 11.0* 7.7 8.0  HGB 10.5* 11.1* 10.9*  HCT 32.5* 33.1* 33.4*  PLT 203 142*  142*   Coag's No results for input(s): APTT, INR in the last 168 hours. BMET  Recent Labs Lab 10/22/14 0248 10/23/14 0250 10/24/14 0242  NA 137 138 141  K 4.6 3.9 3.6*  CL 102 100 101  CO2 20 21 27   BUN 32* 40* 56*  CREATININE 1.51* 1.34 1.64*  GLUCOSE 131* 114* 150*   Electrolytes  Recent Labs Lab 10/22/14 0248 10/23/14 0250 10/24/14 0242  CALCIUM 8.1* 8.5 8.2*  MG 1.8  --   --    Sepsis Markers  Recent Labs Lab 10/17/2014 1739 10/16/2014 1819 10/22/2014 2115  LATICACIDVEN 4.8* 4.90* 3.87*   ABG No results for input(s): PHART, PCO2ART, PO2ART in the last 168 hours. Liver Enzymes  Recent Labs Lab 10/08/2014 1739  AST 47*  ALT 22  ALKPHOS 84  BILITOT 0.8  ALBUMIN 2.9*   Cardiac Enzymes  Recent Labs Lab 10/19/2014 1739 10/22/14 1224 10/23/14 0928  TROPONINI  --  0.81* 1.05*  PROBNP 8315.0*  --   --    Glucose No results for input(s): GLUCAP in the last 168 hours.  Imaging No results found.   ASSESSMENT / PLAN:  PULMONARY A: Acute respiratory failure, possibly due to RUL PNA (although infiltrate is extremely subtle on 12/17 CXR) vs decompensated heart failure or primary  cardiac cause.  P:   - There is a concern that patient is aspirating.  This is a contraindication for BiPAP use.  However, without it the patient will parish.  Recommend involvement of palliative care at this point as I do not foresee patient doing well. - Agree with abx for presumed R HCAP - Scheduled his albuterol / ipratropium nebs for now - Continue prn NTS  - Careful with aggressive fluids as he is high risk for cardiogenic pulm edema, would keep even at this point.  CARDIOVASCULAR A: A Fib Diastolic CHF HTN P:  - Lasix on hold for now. - Dilt scheduled - Lovenox, Cr is rising, may need to adjust.  RENAL A:  CKD P:   - Follow BMP - Replace electrolytes as indicated.  GASTROINTESTINAL A:  NPO P:   - Aspiration precautions, currently NPO and all medications are  IV.  INFECTIOUS A:  R HCAP P:   BCx2 12/17 >>NTD  UC 12/17 >>NTD RSV 12/17 >>   Vanco 12/18 >>  Cefepime 12/18 >>   ENDOCRINE A:  Hypothyroidism    P:   - Synthroid ordered  NEUROLOGIC A:  Toxic metabolic encephalopathy P:   RASS goal: 0 - Careful with sedating meds  FAMILY  - Updates: No family bedside 12/21.  TODAY'S SUMMARY: Given aspiration risk and continuous need for BiPAP for days, worsening renal function and lack of response/inability to actively diurese, it may be a consideration to involve the palliative care service.  Patient is a full DNR.  PCCM will sign off, please call back if needed.  Alyson ReedyWesam G. Yacoub, M.D. [facility] Pulmonary/Critical Care Medicine. Pager: [phone]. After hours pager: [phone].

## 2014-10-26 DIAGNOSIS — R0600 Dyspnea, unspecified: Secondary | ICD-10-CM

## 2014-10-26 DIAGNOSIS — Z515 Encounter for palliative care: Secondary | ICD-10-CM

## 2014-10-26 DIAGNOSIS — A419 Sepsis, unspecified organism: Secondary | ICD-10-CM | POA: Insufficient documentation

## 2014-10-26 DIAGNOSIS — R0689 Other abnormalities of breathing: Secondary | ICD-10-CM

## 2014-10-26 DIAGNOSIS — R6520 Severe sepsis without septic shock: Secondary | ICD-10-CM

## 2014-10-26 LAB — LEGIONELLA ANTIGEN, URINE

## 2014-10-26 LAB — RESPIRATORY VIRUS PANEL
ADENOVIRUS: NOT DETECTED
INFLUENZA A H3: NOT DETECTED
Influenza A H1: NOT DETECTED
Influenza A: NOT DETECTED
Influenza B: NOT DETECTED
Metapneumovirus: NOT DETECTED
Parainfluenza 1: NOT DETECTED
Parainfluenza 2: NOT DETECTED
Parainfluenza 3: NOT DETECTED
RESPIRATORY SYNCYTIAL VIRUS A: DETECTED — AB
RESPIRATORY SYNCYTIAL VIRUS B: NOT DETECTED
RHINOVIRUS: NOT DETECTED

## 2014-10-26 LAB — BASIC METABOLIC PANEL
ANION GAP: 7 (ref 5–15)
BUN: 61 mg/dL — ABNORMAL HIGH (ref 6–23)
CHLORIDE: 112 meq/L (ref 96–112)
CO2: 24 mmol/L (ref 19–32)
CREATININE: 1.88 mg/dL — AB (ref 0.50–1.35)
Calcium: 8.1 mg/dL — ABNORMAL LOW (ref 8.4–10.5)
GFR calc Af Amer: 31 mL/min — ABNORMAL LOW (ref >90)
GFR calc non Af Amer: 27 mL/min — ABNORMAL LOW (ref >90)
Glucose, Bld: 142 mg/dL — ABNORMAL HIGH (ref 70–99)
Potassium: 4 mmol/L (ref 3.5–5.1)
SODIUM: 143 mmol/L (ref 135–145)

## 2014-10-26 LAB — CBC
HEMATOCRIT: 32.5 % — AB (ref 39.0–52.0)
HEMOGLOBIN: 10.7 g/dL — AB (ref 13.0–17.0)
MCH: 29.1 pg (ref 26.0–34.0)
MCHC: 32.9 g/dL (ref 30.0–36.0)
MCV: 88.3 fL (ref 78.0–100.0)
Platelets: 176 10*3/uL (ref 150–400)
RBC: 3.68 MIL/uL — ABNORMAL LOW (ref 4.22–5.81)
RDW: 14.5 % (ref 11.5–15.5)
WBC: 8.7 10*3/uL (ref 4.0–10.5)

## 2014-10-26 MED ORDER — SODIUM CHLORIDE 0.9 % IV SOLN
INTRAVENOUS | Status: DC
  Administered 2014-10-26 (×2): 1000 mL via INTRAVENOUS

## 2014-10-26 MED ORDER — VANCOMYCIN HCL IN DEXTROSE 750-5 MG/150ML-% IV SOLN: 750.0000 mg | INTRAVENOUS | Status: DC

## 2014-10-26 NOTE — Progress Notes (Signed)
DAILY PROGRESS NOTE  Subjective:  HR improved - remains in flutter at 4-5:1 conduction. On IV digoxin. Still struggling on bipap.   Objective:  Temp:  [97.3 F (36.3 C)-99.7 F (37.6 C)] 98.2 F (36.8 C) (12/22 1158) Pulse Rate:  [44-124] 68 (12/22 1211) Resp:  [14-43] 29 (12/22 1100) BP: (114-148)/(37-111) 130/56 mmHg (12/22 1211) SpO2:  [93 %-100 %] 99 % (12/22 1211) FiO2 (%):  [40 %] 40 % (12/22 1211) Weight:  [131 lb 6.3 oz (59.6 kg)] 131 lb 6.3 oz (59.6 kg) (12/22 0500) Weight change: -4 lb 3 oz (-1.9 kg)  Intake/Output from previous day: 12/21 0701 - 12/22 0700 In: 228 [I.V.:228] Out: 425 [Urine:425]  Intake/Output from this shift: Total I/O In: 3 [I.V.:3] Out: -   Medications: Current Facility-Administered Medications  Medication Dose Route Frequency Provider Last Rate Last Dose  . 0.9 %  sodium chloride infusion  250 mL Intravenous PRN Debbe Odea, MD      . 0.9 %  sodium chloride infusion   Intravenous Continuous Debbe Odea, MD 10 mL/hr at 10/26/14 0300    . 0.9 %  sodium chloride infusion   Intravenous Continuous Debbe Odea, MD 10 mL/hr at 10/26/14 0300    . 0.9 %  sodium chloride infusion   Intravenous Continuous Debbe Odea, MD 100 mL/hr at 10/26/14 0757 1,000 mL at 10/26/14 0757  . acetaminophen (TYLENOL) suppository 650 mg  650 mg Rectal Q6H PRN Alphia Moh, MD      . albuterol (PROVENTIL) (2.5 MG/3ML) 0.083% nebulizer solution 2.5 mg  2.5 mg Nebulization Q4H PRN Collene Gobble, MD      . antiseptic oral rinse (BIOTENE) solution 15 mL  15 mL Mouth Rinse 6 times per day Alphia Moh, MD   15 mL at 10/26/14 1200  . azithromycin (ZITHROMAX) 500 mg in dextrose 5 % 250 mL IVPB  500 mg Intravenous Q24H Debbe Odea, MD   500 mg at 10/25/14 1606  . ceFEPIme (MAXIPIME) 1 g in dextrose 5 % 50 mL IVPB  1 g Intravenous Q24H Shaune Pollack, MD   1 g at 10/25/14 1805  . Chlorhexidine Gluconate Cloth 2 % PADS 6 each  6 each Topical Q0600 Alphia Moh,  MD   6 each at 10/26/14 0542  . digoxin (LANOXIN) 0.25 MG/ML injection 0.0625 mg  0.0625 mg Intravenous Daily Pixie Casino, MD   0.0625 mg at 10/26/14 1005  . diltiazem (CARDIZEM) 100 mg in dextrose 5 % 100 mL (1 mg/mL) infusion  5-15 mg/hr Intravenous Titrated Debbe Odea, MD 15 mL/hr at 10/26/14 0758 15 mg/hr at 10/26/14 0758  . enoxaparin (LOVENOX) injection 60 mg  60 mg Subcutaneous Q24H Benjamine Sprague [place], RPH   60 mg at 10/26/14 0830  . ipratropium-albuterol (DUONEB) 0.5-2.5 (3) MG/3ML nebulizer solution 3 mL  3 mL Nebulization QID Collene Gobble, MD   3 mL at 10/26/14 1209  . levothyroxine (SYNTHROID, LEVOTHROID) injection 37.5 mcg  37.5 mcg Intravenous Daily Debbe Odea, MD   37.5 mcg at 10/26/14 0930  . sodium chloride 0.9 % injection 3 mL  3 mL Intravenous Q12H Alphia Moh, MD   3 mL at 10/25/14 0947  . sodium chloride 0.9 % injection 3 mL  3 mL Intravenous Q12H Alphia Moh, MD   3 mL at 10/26/14 0935  . sodium chloride 0.9 % injection 3 mL  3 mL Intravenous PRN Alphia Moh, MD      . vancomycin (VANCOCIN) IVPB 750 mg/150  ml premix  750 mg Intravenous Q24H Shaune Pollack, MD   750 mg at 10/25/14 4034    Physical Exam: General appearance: alert and moderate distress Lungs: diminished breath sounds bilaterally, rales bilaterally and rhonchi bilaterally Heart: irregularly irregular rhythm and tachycardic Extremities: extremities normal, atraumatic, no cyanosis or edema Pulses: 2+ and symmetric  Lab Results: Results for orders placed or performed during the hospital encounter of 10/20/2014 (from the past 48 hour(s))  Vancomycin, trough     Status: None   Collection Time: 10/24/14  5:20 PM  Result Value Ref Range   Vancomycin Tr 15.9 10.0 - 20.0 ug/mL  Basic metabolic panel     Status: Abnormal   Collection Time: 10/26/14  2:36 AM  Result Value Ref Range   Sodium 143 135 - 145 mmol/L    Comment: Please note change in reference range.   Potassium 4.0 3.5 - 5.1  mmol/L    Comment: Please note change in reference range.   Chloride 112 96 - 112 mEq/L    Comment: DELTA CHECK NOTED   CO2 24 19 - 32 mmol/L   Glucose, Bld 142 (H) 70 - 99 mg/dL   BUN 61 (H) 6 - 23 mg/dL   Creatinine, Ser 1.88 (H) 0.50 - 1.35 mg/dL   Calcium 8.1 (L) 8.4 - 10.5 mg/dL   GFR calc non Af Amer 27 (L) >90 mL/min   GFR calc Af Amer 31 (L) >90 mL/min    Comment: (NOTE) The eGFR has been calculated using the CKD EPI equation. This calculation has not been validated in all clinical situations. eGFR's persistently <90 mL/min signify possible Chronic Kidney Disease.    Anion gap 7 5 - 15  CBC     Status: Abnormal   Collection Time: 10/26/14  2:36 AM  Result Value Ref Range   WBC 8.7 4.0 - 10.5 K/uL   RBC 3.68 (L) 4.22 - 5.81 MIL/uL   Hemoglobin 10.7 (L) 13.0 - 17.0 g/dL   HCT 32.5 (L) 39.0 - 52.0 %   MCV 88.3 78.0 - 100.0 fL   MCH 29.1 26.0 - 34.0 pg   MCHC 32.9 30.0 - 36.0 g/dL   RDW 14.5 11.5 - 15.5 %   Platelets 176 150 - 400 K/uL    Imaging: No results found.  Assessment:  Principal Problem:   Sepsis due to pneumonia Active Problems:   Hypothyroidism   CKD (chronic kidney disease), stage III   Chronic diastolic heart failure   Atrial flutter   AKI (acute kidney injury)   Elevated troponin I level   HCAP (healthcare-associated pneumonia)   Respiratory distress   Plan:  Continues to struggle with respiration. Requiring BIPAP support. Now full DNR. A-fib rate is controlled now with digoxin and IV diltiazem. Unlikely to maintain sats off of bipap. Palliative care discussion with the family is recommended as he is not eating and had probably aspirated on pills the last time he tried to take them.   Time Spent Directly with Patient:  15 minutes  Length of Stay:  LOS: 5 days   Pixie Casino, MD, [facility] Attending Cardiologist [facility]  HILTY,Kenneth C 10/26/2014, 12:12 PM

## 2014-10-26 NOTE — Progress Notes (Signed)
[facility] Progress Note   Larry Underwood SLH:[id number] DOB: [birth year] DOA: 10/17/2014 PCP: Estill Dooms, MD  Brief narrative: Larry Underwood is a 80 y.o. male with a past medical history of diastolic heart failure, atrial flutter on pradaxa, hypertension, CKD3 and prior pneumonia who presents from skilled nursing facility due to respiratory distress and is found to have a right upper lobe infiltrate, fever of 102.9, WBC count of 11.3, lactic acidosis, mildly elevated troponin at 0.19 and mild increase in creatinine from baseline.   Subjective: Patient still requires BIPAP. Discussion regarding goals of care held with patient and family members today. They are agreeable to meet with palliative care.  Assessment/Plan: Principal Problem:   Sepsis due to pneumonia -Continue vancomycin and cefepime-note MRSA screen is positive -Viral panel detected respiratory syncytial virus -Continue supportive care, NPPV, empiric IV antimicrobial therapy for the possibility of superimposed bacterial infection -Blood cultures obtained on 10/31/2014 showing no growth -Palliative care consulted  Active Problems:  Atrial flutter -Suspect precipitated by underlying pneumonia -Continue diltiazem IV and digitoxin 0.0625 mg IV daily -He is currently rate controlled as his heart rates have mostly been in the 70s -Cardiology following  Elevated troponin I -Not a cardiac cath candidate    AKI on CKD (chronic kidney disease), stage III -Creatinine trending up to 1.88 from 1.64 on 10/24/2014 -Patient receiving IV Lasix during this hospitalization -Minimal by mouth intake likely component    Chronic diastolic heart failure - Lasix on hold  Goals of care -Extensive discussion held today with patient and family members regarding goals of care. He continues to do poorly and requires BIPAP. Nursing staff reporting that his oxygen saturations falling into the 80s when BiPAP was held. With his  advanced age and multiple medical problems prognosis remains poor. He is currently a DO NOT RESUSCITATE. Mr. Lierman did not feel that he was ready to transition to full comfort care on my evaluation this morning. Larry Underwood continue current medical management until he is seen and evaluated by palliative care. Reassess status in a.m.   Code Status: DNR Family Communication: With son Disposition Plan: Palliative care consulted DVT prophylaxis: full dose Lovenox Consultants: none  Procedures: none  Antibiotics: Anti-infectives    Start     Dose/Rate Route Frequency Ordered Stop   10/25/14 1500  azithromycin (ZITHROMAX) 500 mg in dextrose 5 % 250 mL IVPB     500 mg250 mL/hr over 60 Minutes Intravenous Every 24 hours 10/25/14 1436     10/22/14 1800  ceFEPIme (MAXIPIME) 1 g in dextrose 5 % 50 mL IVPB     1 g100 mL/hr over 30 Minutes Intravenous Every 24 hours 11/02/2014 1853     10/22/14 1800  vancomycin (VANCOCIN) IVPB 750 mg/150 ml premix     750 mg150 mL/hr over 60 Minutes Intravenous Every 24 hours 10/20/2014 1853     10/12/2014 1815  ceFEPIme (MAXIPIME) 2 g in dextrose 5 % 50 mL IVPB     2 g100 mL/hr over 30 Minutes Intravenous  Once 10/25/2014 1802 11/02/2014 1852   10/13/2014 1815  vancomycin (VANCOCIN) IVPB 1000 mg/200 mL premix     1,000 mg200 mL/hr over 60 Minutes Intravenous  Once 10/09/2014 1802 10/28/2014 1930      Objective: Filed Weights   10/24/14 0500 10/25/14 0500 10/26/14 0500  Weight: 62.5 kg (137 lb 12.6 oz) 61.5 kg (135 lb 9.3 oz) 59.6 kg (131 lb 6.3 oz)    Intake/Output Summary (Last 24 hours) at 10/26/14 1316  Last data filed at 10/26/14 0935  Gross per 24 hour  Intake    141 ml  Output    125 ml  Net     16 ml     Vitals Filed Vitals:   10/26/14 1000 10/26/14 1100 10/26/14 1158 10/26/14 1211  BP: 133/54 130/50  130/56  Pulse: 68 67  68  Temp:   98.2 F (36.8 C)   TempSrc:   Axillary   Resp: 14 29  38  Height:      Weight:      SpO2: 95% 96%  99%    Exam: General:  Awake alert oriented 3, continues to have resp distress with tachypnea Lungs: Bilateral rhonchi, no crackles or wheezing Cardiovascular: Regular rate and rhythm -without murmur gallop or rub normal S1 and S2 Abdomen: Nontender, nondistended, soft, bowel sounds positive, no rebound, no ascites, no appreciable mass Extremities: No significant cyanosis, clubbing, or edema bilateral lower extremities  Data Reviewed: Basic Metabolic Panel:  Recent Labs Lab 10/30/2014 1739 10/22/14 0248 10/23/14 0250 10/24/14 0242 10/26/14 0236  NA 133* 137 138 141 143  K 4.3 4.6 3.9 3.6* 4.0  CL 97 102 100 101 112  CO2 19 20 21 27 24   GLUCOSE 201* 131* 114* 150* 142*  BUN 30* 32* 40* 56* 61*  CREATININE 1.47* 1.51* 1.34 1.64* 1.88*  CALCIUM 8.2* 8.1* 8.5 8.2* 8.1*  MG  --  1.8  --   --   --    Liver Function Tests:  Recent Labs Lab 10/16/2014 1739  AST 47*  ALT 22  ALKPHOS 84  BILITOT 0.8  PROT 6.7  ALBUMIN 2.9*   No results for input(s): LIPASE, AMYLASE in the last 168 hours. No results for input(s): AMMONIA in the last 168 hours. CBC:  Recent Labs Lab 11/03/2014 1739 10/22/14 0248 10/23/14 0250 10/24/14 0242 10/26/14 0236  WBC 11.7* 11.0* 7.7 8.0 8.7  NEUTROABS 10.8*  --   --   --   --   HGB 11.4* 10.5* 11.1* 10.9* 10.7*  HCT 35.4* 32.5* 33.1* 33.4* 32.5*  MCV 91.0 91.0 90.7 92.0 88.3  PLT 149* 203 142* 142* 176   Cardiac Enzymes:  Recent Labs Lab 10/22/14 1224 10/23/14 0928  TROPONINI 0.81* 1.05*   BNP (last 3 results)  Recent Labs  10/30/2014 1739  PROBNP 8315.0*   CBG: No results for input(s): GLUCAP in the last 168 hours.  Recent Results (from the past 240 hour(s))  Blood Culture (routine x 2)     Status: None (Preliminary result)   Collection Time: 10/29/2014  6:10 PM  Result Value Ref Range Status   Specimen Description BLOOD ARM RIGHT  Final   Special Requests BOTTLES DRAWN AEROBIC AND ANAEROBIC 5CC  Final   Culture  Setup Time   Final    10/22/2014  01:21 Performed at [facility]    Culture   Final           BLOOD CULTURE RECEIVED NO GROWTH TO DATE CULTURE WILL BE HELD FOR 5 DAYS BEFORE ISSUING A FINAL NEGATIVE REPORT Performed at [facility]    Report Status PENDING  Incomplete  Blood Culture (routine x 2)     Status: None (Preliminary result)   Collection Time: 10/06/2014  6:20 PM  Result Value Ref Range Status   Specimen Description BLOOD ARM LEFT  Final   Special Requests BOTTLES DRAWN AEROBIC AND ANAEROBIC 5CC  Final   Culture  Setup Time   Final  10/22/2014 01:21 Performed at [facility]    Culture   Final           BLOOD CULTURE RECEIVED NO GROWTH TO DATE CULTURE WILL BE HELD FOR 5 DAYS BEFORE ISSUING A FINAL NEGATIVE REPORT Performed at [facility]    Report Status PENDING  Incomplete  Urine culture     Status: None   Collection Time: 10/29/2014  6:26 PM  Result Value Ref Range Status   Specimen Description URINE, CATHETERIZED  Final   Special Requests NONE  Final   Culture  Setup Time   Final    10/22/2014 01:21 Performed at [place] Performed at [facility]   Final   Culture NO GROWTH Performed at [facility]   Final   Report Status 10/22/2014 FINAL  Final  MRSA PCR Screening     Status: Abnormal   Collection Time: 10/22/14 12:06 AM  Result Value Ref Range Status   MRSA by PCR POSITIVE (A) NEGATIVE Final    Comment:        The GeneXpert MRSA Assay (FDA approved for NASAL specimens only), is one component of a comprehensive MRSA colonization surveillance program. It is not intended to diagnose MRSA infection nor to guide or monitor treatment for MRSA infections. RESULT CALLED TO, READ BACK BY AND VERIFIED WITH: T TOMLINSON,RN [id number] [place]   Respiratory virus panel (routine influenza)     Status: Abnormal   Collection Time: 10/22/14  2:45 AM  Result Value Ref Range Status   Source - RVPAN  NASOPHARYNGEAL  Final   Respiratory Syncytial Virus A DETECTED (A)  Final   Respiratory Syncytial Virus B NOT DETECTED  Final   Influenza A NOT DETECTED  Final   Influenza B NOT DETECTED  Final   Parainfluenza 1 NOT DETECTED  Final   Parainfluenza 2 NOT DETECTED  Final   Parainfluenza 3 NOT DETECTED  Final   Metapneumovirus NOT DETECTED  Final   Rhinovirus NOT DETECTED  Final   Adenovirus NOT DETECTED  Final   Influenza A H1 NOT DETECTED  Final   Influenza A H3 NOT DETECTED  Final    Comment: (NOTE)       Normal Reference Range for each Analyte: NOT DETECTED Testing performed using the Luminex xTAG Respiratory Viral Panel test kit. The analytical performance characteristics of this assay have been determined by [facility].  The modifications have not been cleared or approved by the FDA. This assay has been validated pursuant to the CLIA regulations and is used for clinical purposes. Performed at [facility]      Studies:  Recent x-ray studies have been reviewed in detail by the Attending Physician  Scheduled Meds:  Scheduled Meds: . antiseptic oral rinse  15 mL Mouth Rinse 6 times per day  . azithromycin  500 mg Intravenous Q24H  . ceFEPime (MAXIPIME) IV  1 g Intravenous Q24H  . Chlorhexidine Gluconate Cloth  6 each Topical Q0600  . digoxin  0.0625 mg Intravenous Daily  . enoxaparin (LOVENOX) injection  60 mg Subcutaneous Q24H  . ipratropium-albuterol  3 mL Nebulization QID  . levothyroxine  37.5 mcg Intravenous Daily  . sodium chloride  3 mL Intravenous Q12H  . sodium chloride  3 mL Intravenous Q12H  . vancomycin  750 mg Intravenous Q24H   Continuous Infusions: . sodium chloride 10 mL/hr at 10/26/14 0300  . sodium chloride 10 mL/hr  at 10/26/14 0300  . sodium chloride 1,000 mL (10/26/14 0757)  . diltiazem (CARDIZEM) infusion 15 mg/hr (10/26/14 0758)    Time spent on care of this patient: 35 min   Kelvin Cellar, MD 10/26/2014, 1:16 PM  LOS:  5 days   [facility] Office  [phone] Pager - Text Page per [email or web address]  If 7PM-7AM, please contact night-coverage [email or web address]

## 2014-10-26 NOTE — Progress Notes (Signed)
ANTIBIOTIC CONSULT NOTE - FOLLOW UP  Pharmacy Consult for Vancomycin and Cefepime Indication: HCAP  No Known Allergies  Patient Measurements: Height: 5\' 8"  (172.7 cm) Weight: 131 lb 6.3 oz (59.6 kg) IBW/kg (Calculated) : 68.4  Vital Signs: Temp: 98.2 F (36.8 C) (12/22 1158) Temp Source: Axillary (12/22 1158) BP: 130/56 mmHg (12/22 1211) Pulse Rate: 68 (12/22 1211) Intake/Output from previous day: 12/21 0701 - 12/22 0700 In: 228 [I.V.:228] Out: 425 [Urine:425] Intake/Output from this shift: Total I/O In: 3 [I.V.:3] Out: -   Labs:  Recent Labs  10/24/14 0242 10/26/14 0236  WBC 8.0 8.7  HGB 10.9* 10.7*  PLT 142* 176  CREATININE 1.64* 1.88*   Estimated Creatinine Clearance: 16.3 mL/min (by C-G formula based on Cr of 1.88).  Recent Labs  10/24/14 1720  VANCOTROUGH 15.9    Assessment: 89yom continues on day #6 vancomycin and cefepime for HCAP. Vancomycin level was at goal a few days ago but renal function is now worsening.   Vancomycin 12/17>> 12/20 VT = 15.9 on 750 q24 Cefepime 12/17>> Azithromycin 12/21>>  12/18 RSV> + virus A MRSA PCR + 12/17 Urine> negative 12/17 Blood x 2> ngtd 12/17 Strep pneumo antigen>negative  Goal of Therapy:  Vancomycin trough level 15-20 mcg/ml  Plan:  1) Change vancomycin to 750mg  IV q48 2) Continue cefepime 1g IV q24 3) Continue to follow renal function  Fredrik RiggerMarkle, Mattia Osterman Sue  10/26/2014,1:52 PM

## 2014-10-26 NOTE — Progress Notes (Signed)
CSW (Clinical Social Worker) aware of consult and that pt was admitted from [facility] facility. CSW to assess if needed once disposition is determined.  Huel Centola, LCSWA [phone]

## 2014-10-26 NOTE — Consult Note (Signed)
Patient ZO:XWRUEA:[name]      DOB: [birth year]      VWU:[id number]     [facility]    Consult Requested by: Dr. Butler Denmarkizwan     PCP: Kimber RelicGREEN, ARTHUR G, MD Reason for Consultation: GOC     Phone Number:[phone]  Assessment of patients Current state: I spoke today with briefly with Larry Underwood but he seemed very tired and has had multiple conversations with staff today including Dr. Vanessa BarbaraZamora who tells me that he spoke with Larry Underwood and they agree to another 24 hours of BiPAP. I did tell Larry Underwood that I will follow along and will help him when it is time to come off of BiPAP be comfortable - will discuss more with him tomorrow. I did speak with his son and daughter and briefly with Mrs. Riley Lamouglas. I explained the procedure and my recommendations if they come to the decision and Larry Underwood is ready to focus more on comfort. I recommended that we premedicate with morphine prior to d/c of BiPAP until we get to a level of comfortable breathing and then transition off BiPAP (I did tell them the goal would be ease of breathing and not oxygen saturation levels at this point). His children acknowledge this understanding but I believe that Larry Underwood is the one who has not come to terms with this transition yet. I will let him rest further today and will plan to talk more with Larry Underwood tomorrow.     Goals of Care: 1.  Code Status: DNR   2. Scope of Treatment: Continue BiPAP and supportive treatment. Will reassess tomorrow.    4. Disposition: To be determined on outcomes.    3. Symptom Management:   1. Dyspnea: Consider low dose morphine. I would use morphine 2 mg IV prior to d/c BiPAP if comfort care is decided.   4. Psychosocial: Emotional support provided to patient and family.    Brief HPI: [id number] yo male admitted with respiratory distress from SNF r/t sepsis pneumonia, diastolic heart failure, acute on chronic kidney failure requiring BiPAP.  He has required BiPAP since 12/20 and wishes to continue for another 24 hours - per conversation with Dr. Vanessa BarbaraZamora. PMH reviewed below.    ROS: + dyspnea    PMH:  Past Medical History  Diagnosis Date  . CHF (congestive heart failure) 01/04/2010  . Atrial fib/flutter, transient 10/10/2011  . Hypertension 12/27/2009  . Other abnormal blood chemistry 03/24/2012    Hyperglycemia  . Temporomandibular joint sounds on opening and/or closing the jaw 10/15/2011  . Hypertrophy of prostate with urinary obstruction and other lower urinary tract symptoms (LUTS) 03/19/2011  . Abnormality of gait 12/11/2010  . Nocturia 12/11/2010  . Unspecified urinary incontinence 12/11/2010  . Unspecified hypothyroidism 06/05/2010  . Chronic kidney disease, stage II (mild) 06/05/2010  . Shortness of breath 02/20/2010  . Hearing loss 01/06/2010  . Debility, unspecified 01/04/2010  . Long term (current) use of anticoagulants 01/04/2010  . Other and unspecified hyperlipidemia 12/27/2009  . Essential and other specified forms of tremor 12/27/2009  . Senile cataract, unspecified 12/27/2009  . Coronary atherosclerosis of native coronary artery 01/07/2003  . Reflux esophagitis 01/07/2003  . Diaphragmatic hernia without mention of obstruction or gangrene 01/07/2003     PSH: Past Surgical History  Procedure Laterality Date  . Cardiac surgery    . Appendectomy    . Inguinal hernia repair Right 1940  . Inguinal hernia repair Right  1990    Re-do with mesh   I have reviewed the FH and SH and  If appropriate update it with new information. No Known Allergies Scheduled Meds: . antiseptic oral rinse  15 mL Mouth Rinse 6 times per day  . azithromycin  500 mg Intravenous Q24H  . ceFEPime (MAXIPIME) IV  1 g Intravenous Q24H  . Chlorhexidine Gluconate Cloth  6 each Topical Q0600  . digoxin  0.0625 mg Intravenous Daily  . enoxaparin (LOVENOX) injection  60 mg Subcutaneous Q24H  . ipratropium-albuterol  3 mL  Nebulization QID  . levothyroxine  37.5 mcg Intravenous Daily  . sodium chloride  3 mL Intravenous Q12H  . sodium chloride  3 mL Intravenous Q12H  . vancomycin  750 mg Intravenous Q24H   Continuous Infusions: . sodium chloride 10 mL/hr at 10/26/14 0300  . sodium chloride 10 mL/hr at 10/26/14 0300  . sodium chloride 1,000 mL (10/26/14 0757)  . diltiazem (CARDIZEM) infusion 15 mg/hr (10/26/14 0758)   PRN Meds:.sodium chloride, [DISCONTINUED] acetaminophen **OR** acetaminophen, albuterol, sodium chloride    BP 116/74 mmHg  Pulse 68  Temp(Src) 98.4 F (36.9 C) (Axillary)  Resp 42  Ht 5\' 8"  (1.727 m)  Wt 59.6 kg (131 lb 6.3 oz)  BMI 19.98 kg/m2  SpO2 96%   PPS: 20%   Intake/Output Summary (Last 24 hours) at 10/26/14 1100 Last data filed at 10/26/14 0935  Gross per 24 hour  Intake    171 ml  Output    325 ml  Net   -154 ml   LBM: 10/22/14  Physical Exam:  General: NAD, resting on BiPAP HEENT: [place]/AT, no JVD, moist mucous membranes, no teeth Chest: Rhonchi bilateral, small exp wheeze CVS: RRR, S1 S2 Abdomen: Soft, NT, ND, +BS Ext: MAE, no edema, warm to touch - toes cool to touch Neuro: Appears exhausted, oriented x 3  Labs: CBC    Component Value Date/Time   WBC 8.7 10/26/2014 0236   RBC 3.68* 10/26/2014 0236   HGB 10.7* 10/26/2014 0236   HCT 32.5* 10/26/2014 0236   PLT 176 10/26/2014 0236   MCV 88.3 10/26/2014 0236   MCH 29.1 10/26/2014 0236   MCHC 32.9 10/26/2014 0236   RDW 14.5 10/26/2014 0236   LYMPHSABS 0.4* 05-Mar-2014 1739   MONOABS 0.5 05-Mar-2014 1739   EOSABS 0.0 05-Mar-2014 1739   BASOSABS 0.0 05-Mar-2014 1739    BMET    Component Value Date/Time   NA 143 10/26/2014 0236   NA 138 01/28/2014   K 4.0 10/26/2014 0236   CL 112 10/26/2014 0236   CO2 24 10/26/2014 0236   GLUCOSE 142* 10/26/2014 0236   BUN 61* 10/26/2014 0236   BUN 26* 01/28/2014   CREATININE 1.88* 10/26/2014 0236   CREATININE 1.3 01/28/2014   CALCIUM 8.1* 10/26/2014 0236    GFRNONAA 27* 10/26/2014 0236   GFRAA 31* 10/26/2014 0236    CMP     Component Value Date/Time   NA 143 10/26/2014 0236   NA 138 01/28/2014   K 4.0 10/26/2014 0236   CL 112 10/26/2014 0236   CO2 24 10/26/2014 0236   GLUCOSE 142* 10/26/2014 0236   BUN 61* 10/26/2014 0236   BUN 26* 01/28/2014   CREATININE 1.88* 10/26/2014 0236   CREATININE 1.3 01/28/2014   CALCIUM 8.1* 10/26/2014 0236   PROT 6.7 05-Mar-2014 1739   ALBUMIN 2.9* 05-Mar-2014 1739   AST 47* 05-Mar-2014 1739   ALT 22 05-Mar-2014 1739   ALKPHOS 84 05-Mar-2014 1739  BILITOT 0.8 10/10/2014 1739   GFRNONAA 27* 10/26/2014 0236   GFRAA 31* 10/26/2014 0236    Time In Time Out Total Time Spent with Patient Total Overall Time  1035 1120     Greater than 50%  of this time was spent counseling and coordinating care related to the above assessment and plan.  Yong Channel, NP Palliative Medicine Team Pager # [phone] (M-F 8a-5p) Team Phone # [phone] (Nights/Weekends)10

## 2014-10-26 NOTE — Progress Notes (Signed)
Pt respiratory panel came back positive for RSV-A.  Spoke with Claris CheMargaret from infection prevention and she stated that adult patients with RSV do not need to be on droplet precautions

## 2014-10-27 DIAGNOSIS — R0602 Shortness of breath: Secondary | ICD-10-CM | POA: Insufficient documentation

## 2014-10-27 DIAGNOSIS — R6520 Severe sepsis without septic shock: Secondary | ICD-10-CM

## 2014-10-27 MED ORDER — MORPHINE BOLUS VIA INFUSION
4.0000 mg | INTRAVENOUS | Status: DC | PRN
  Filled 2014-10-27: qty 4

## 2014-10-27 MED ORDER — ATROPINE SULFATE 1 % OP SOLN
4.0000 [drp] | OPHTHALMIC | Status: DC
  Administered 2014-10-27: 4 [drp] via SUBLINGUAL

## 2014-10-27 MED ORDER — ATROPINE SULFATE 1 % OP SOLN
4.0000 [drp] | OPHTHALMIC | Status: DC | PRN
  Administered 2014-10-27: 4 [drp] via SUBLINGUAL
  Filled 2014-10-27 (×2): qty 2

## 2014-10-27 MED ORDER — SODIUM CHLORIDE 0.9 % IV SOLN: 250.0000 mL | INTRAVENOUS | Status: DC | PRN

## 2014-10-27 MED ORDER — SCOPOLAMINE 1 MG/3DAYS TD PT72
1.0000 | MEDICATED_PATCH | TRANSDERMAL | Status: DC
Start: 2014-10-27 — End: 2014-10-28
  Administered 2014-10-27: 1.5 mg via TRANSDERMAL
  Filled 2014-10-27: qty 1

## 2014-10-27 MED ORDER — LORAZEPAM 2 MG/ML IJ SOLN
1.0000 mg | INTRAMUSCULAR | Status: DC | PRN
  Administered 2014-10-27: 1 mg via INTRAVENOUS
  Filled 2014-10-27: qty 1

## 2014-10-27 MED ORDER — MORPHINE SULFATE 2 MG/ML IJ SOLN
INTRAMUSCULAR | Status: AC
  Administered 2014-10-27: 2 mg via INTRAVENOUS
  Filled 2014-10-27: qty 1

## 2014-10-27 MED ORDER — MORPHINE BOLUS VIA INFUSION
2.0000 mg | INTRAVENOUS | Status: DC | PRN
  Filled 2014-10-27: qty 4

## 2014-10-27 MED ORDER — MORPHINE SULFATE 2 MG/ML IJ SOLN
2.0000 mg | INTRAMUSCULAR | Status: DC | PRN
  Administered 2014-10-27: 2 mg via INTRAVENOUS
  Filled 2014-10-27: qty 1

## 2014-10-27 MED ORDER — MORPHINE SULFATE 2 MG/ML IJ SOLN: 2.0000 mg | INTRAMUSCULAR | Status: DC

## 2014-10-27 MED ORDER — MORPHINE SULFATE 25 MG/ML IV SOLN
3.0000 mg/h | INTRAVENOUS | Status: DC
  Administered 2014-10-27: 3 mg/h via INTRAVENOUS
  Administered 2014-10-27: 7 mg/h via INTRAVENOUS
  Filled 2014-10-27: qty 10

## 2014-10-27 MED ORDER — LORAZEPAM 2 MG/ML IJ SOLN: 0.5000 mg | INTRAMUSCULAR | Status: DC | PRN

## 2014-10-28 LAB — CULTURE, BLOOD (ROUTINE X 2)
Culture: NO GROWTH
Culture: NO GROWTH

## 2014-11-05 NOTE — Progress Notes (Signed)
Becoming less responsive. Still struggling on bipap. Palliative care consultation placed, which is appropriate at this time. HR stable on cardizem and IV digoxin with 4-5:1 atrial flutter. This is not playing a significant role in his decompensation. Would continue current cardiac medications.   Chrystie NoseKenneth C. Hilty, MD, [facility] Attending Cardiologist [facility]

## 2014-11-05 NOTE — Progress Notes (Signed)
Pt here for PNA with sepsis and was a DNR and comfort care. Pt expired at 2129, pronounced by 2 RN. Death certificate completed and given to unit secretary. Jimmye NormanKaren Kirby-Graham, NP [facility]

## 2014-11-05 NOTE — Progress Notes (Signed)
Transferred to [facility] room23 by bed, report given to RN, belongings taken by family.

## 2014-11-05 NOTE — Care Management Note (Addendum)
    Page 1 of 1   April 29, 2014     10:13:03 AM CARE MANAGEMENT NOTE April 29, 2014  Patient:  Miachel RouxDOUGLAS,Kahlil D   Account Number:  [id number]  Date Initiated:  10/22/2014  Documentation initiated by:  Junius CreamerWELL,DEBBIE  Subjective/Objective Assessment:   adm w sepsis     Action/Plan:   from [facility] per chart   Anticipated DC Date:     Anticipated DC Plan:  SKILLED NURSING FACILITY  In-house referral  Clinical Social Worker         Choice offered to / List presented to:             Status of service:   Medicare Important Message given?  YES (If response is "NO", the following Medicare IM given date fields will be blank) Date Medicare IM given:  10/25/2014 Medicare IM given by:  Junius CreamerWELL,DEBBIE Date Additional Medicare IM given:  April 29, 2014 Additional Medicare IM given by:  [facility] Crystina Borrayo  Discharge Disposition:    Per UR Regulation:  Reviewed for med. necessity/level of care/duration of stay  If discussed at Long Length of Stay Meetings, dates discussed:   10/26/2014  10/28/2014    Comments:

## 2014-11-05 NOTE — Progress Notes (Signed)
Wasted 197cc of morphine IV  down sink, waste was witnessed with Harriett SineNancy C.,RN.

## 2014-11-05 NOTE — Progress Notes (Signed)
Pt taken off BiPAP per RN and started on morphine. Placed on 2 LPM. Palliative care at this time. RT will continue to monitor patient.

## 2014-11-05 NOTE — Progress Notes (Signed)
Bipap alarms all the time, mask is big that leaking noted. Resp. Therapist made aware claimed that the present mask is th only one that fits him. Mask needs adjustment constantly. Continue to monitor.

## 2014-11-05 NOTE — Progress Notes (Signed)
Patient expired at 2129, verified and pronounced by two RNs  Ryland GroupBrandyn  Deone Leifheit and SaucierNancy C. Paged on call K.Schorr of pt's death. Patient's son and daughter with other family members were at pt's bedside at time of death.

## 2014-11-05 NOTE — Progress Notes (Signed)
Progress Note from the Palliative Medicine Team at [facility]: I met today with [name] and her daughter and also his son Wasim Larry Underwood). We discussed again today with Mrs. Talerico about removing the BiPAP and Larry Underwood helps to explain what he has been told by all the other providers. They decide that they wish to remove BiPAP and transition to full comfort care at this time. With the help of nursing Mr. Gappa was given a bolus of 4 mg IV morphine and taken off BiPAP and placed on 2 L [place] and then morphine gtt was initiated to achieve comfort.    Objective: No Known Allergies Scheduled Meds: . antiseptic oral rinse  15 mL Mouth Rinse 6 times per day  . ipratropium-albuterol  3 mL Nebulization QID  .  morphine injection  2 mg Intravenous STAT  . scopolamine  1 patch Transdermal Q72H   Continuous Infusions: . sodium chloride Stopped (10/26/14 2000)  . sodium chloride 10 mL/hr at 10/26/14 0300  . sodium chloride 1,000 mL (10/26/14 1730)  . morphine 3 mg/hr (11/24/14 1251)   PRN Meds:.sodium chloride, [DISCONTINUED] acetaminophen **OR** acetaminophen, albuterol, atropine, LORazepam, morphine injection, morphine  BP 138/46 mmHg  Pulse 67  Temp(Src) 97.6 F (36.4 C) (Oral)  Resp 22  Ht 5' 8"  (1.727 m)  Wt 60 kg (132 lb 4.4 oz)  BMI 20.12 kg/m2  SpO2 100%   PPS: 10%   Intake/Output Summary (Last 24 hours) at 11-24-2014 1309 Last data filed at November 24, 2014 1300  Gross per 24 hour  Intake   3675 ml  Output    450 ml  Net   3225 ml       Physical Exam:  General: NAD, resting on BiPAP HEENT: [place]/AT, no JVD, moist mucous membranes, no teeth Chest: Rhonchi bilateral, symmetric CVS: RRR, S1 S2 Abdomen: Soft, NT, ND, +BS Ext: MAE, no edema, warm to touch - toes cool to touch Neuro: Obtunded, much less responsive today   Labs: CBC    Component Value Date/Time   WBC 8.7 10/26/2014 0236   RBC 3.68* 10/26/2014 0236   HGB 10.7* 10/26/2014 0236   HCT 32.5* 10/26/2014 0236    PLT 176 10/26/2014 0236   MCV 88.3 10/26/2014 0236   MCH 29.1 10/26/2014 0236   MCHC 32.9 10/26/2014 0236   RDW 14.5 10/26/2014 0236   LYMPHSABS 0.4* 10/16/2014 1739   MONOABS 0.5 10/24/2014 1739   EOSABS 0.0 11/04/2014 1739   BASOSABS 0.0 10/28/2014 1739    BMET    Component Value Date/Time   NA 143 10/26/2014 0236   NA 138 01/28/2014   K 4.0 10/26/2014 0236   CL 112 10/26/2014 0236   CO2 24 10/26/2014 0236   GLUCOSE 142* 10/26/2014 0236   BUN 61* 10/26/2014 0236   BUN 26* 01/28/2014   CREATININE 1.88* 10/26/2014 0236   CREATININE 1.3 01/28/2014   CALCIUM 8.1* 10/26/2014 0236   GFRNONAA 27* 10/26/2014 0236   GFRAA 31* 10/26/2014 0236    CMP     Component Value Date/Time   NA 143 10/26/2014 0236   NA 138 01/28/2014   K 4.0 10/26/2014 0236   CL 112 10/26/2014 0236   CO2 24 10/26/2014 0236   GLUCOSE 142* 10/26/2014 0236   BUN 61* 10/26/2014 0236   BUN 26* 01/28/2014   CREATININE 1.88* 10/26/2014 0236   CREATININE 1.3 01/28/2014   CALCIUM 8.1* 10/26/2014 0236   PROT 6.7 11/03/2014 1739   ALBUMIN 2.9* 10/26/2014 1739  AST 47* 10/29/2014 1739   ALT 22 10/24/2014 1739   ALKPHOS 84 10/24/2014 1739   BILITOT 0.8 10/31/2014 1739   GFRNONAA 27* 10/26/2014 0236   GFRAA 31* 10/26/2014 0236   Assessment and Plan: 1. Code Status: DNR 2. Symptom Control: 1. Pain/dyspnea: Morphine infusion initiated at 3 mg/hr titrated to comfort. Bolus 2-4 mg every 15 min prn.  2. Agitation: Lorazepam 0.5 mg every 4 hours prn.  3. Secretions: Scopolamine patch. Atropine SL every 4 hours prn.  3. Psycho/Social: Emotional support provided to patient and to family at bedside.  4. Spiritual: Church members have been present and visiting. Idelle Crouch has given communion per family.  5. Disposition: Anticipate hospital death.     Time In Time Out Total Time Spent with Patient Total Overall Time  1140 1230 62mn 520m    Greater than 50%  of this time was spent counseling and coordinating  care related to the above assessment and plan.  AlVinie SillNP Palliative Medicine Team Pager # [phone]-F 8a-5p) Team Phone # [phone]/Weekends)

## 2014-11-05 NOTE — Progress Notes (Signed)
[facility] Progress Note   KAMAL JURGENS RAX:[id number] DOB: [birth year] DOA: 10/07/2014 PCP: Estill Dooms, MD  Brief narrative: OZIL STETTLER is a 81 y.o. male with a past medical history of diastolic heart failure, atrial flutter on pradaxa, hypertension, CKD3 and prior pneumonia who presents from skilled nursing facility due to respiratory distress and is found to have a right upper lobe infiltrate, fever of 102.9, WBC count of 11.3, lactic acidosis, mildly elevated troponin at 0.19 and mild increase in creatinine from baseline.   Subjective: Patient becoming less responsive in the past 24 hours, remains on Bipap. He was arousable however had difficulty following commands.   Assessment/Plan: Principal Problem:   Sepsis due to pneumonia -Despite continuing BIPAP over the last 24 hours he has not shown meaningful improvement.  -Viral panel positive for RSV, was continued on broad spectrum AB's given possibility of superimposed underlying bacterial infection. IV antimicrobials not likely to change outcome, will discontinue IV Cefepime and Vanc -Given advanced age, comorbidities and BiPAP requirement his prognosis is poor -Will discuss with family today transitioning to full comfort measures, for now continue current treatment.   Active Problems:  Atrial flutter -Suspect precipitated by underlying pneumonia -Continue diltiazem IV and digitoxin 0.0625 mg IV daily -He is currently rate controlled as his heart rates have mostly been in the 70s -Cardiology following  Elevated troponin I -Not a cardiac cath candidate    AKI on CKD (chronic kidney disease), stage III -Creatinine trending up to 1.88 from 1.64 on 10/24/2014 -Patient receiving IV Lasix during this hospitalization -Minimal by mouth intake likely component    Chronic diastolic heart failure - Lasix on hold  Goals of care -Despite continuing BiPAP for the last 24 hours he has not shown meaningful  improvement, and appears less responsive today. I think transition to full comfort is reasonable at this point as the continuation of BiPAP would likely prolong death and possibly not be indicated with him becoming less responsive. Will readdress goals of care today with family. Palliative Care following.    Code Status: DNR Family Communication: With son Disposition Plan: Palliative care consulted DVT prophylaxis: full dose Lovenox Consultants: none  Procedures: none  Antibiotics: Anti-infectives    Start     Dose/Rate Route Frequency Ordered Stop   10-30-2014 1800  vancomycin (VANCOCIN) IVPB 750 mg/150 ml premix     750 mg150 mL/hr over 60 Minutes Intravenous Every 48 hours 10/26/14 1351     10/25/14 1500  azithromycin (ZITHROMAX) 500 mg in dextrose 5 % 250 mL IVPB     500 mg250 mL/hr over 60 Minutes Intravenous Every 24 hours 10/25/14 1436     10/22/14 1800  ceFEPIme (MAXIPIME) 1 g in dextrose 5 % 50 mL IVPB     1 g100 mL/hr over 30 Minutes Intravenous Every 24 hours 10/23/2014 1853     10/22/14 1800  vancomycin (VANCOCIN) IVPB 750 mg/150 ml premix  Status:  Discontinued     750 mg150 mL/hr over 60 Minutes Intravenous Every 24 hours 10/28/2014 1853 10/26/14 1351   10/22/2014 1815  ceFEPIme (MAXIPIME) 2 g in dextrose 5 % 50 mL IVPB     2 g100 mL/hr over 30 Minutes Intravenous  Once 10/29/2014 1802 10/22/2014 1852   10/17/2014 1815  vancomycin (VANCOCIN) IVPB 1000 mg/200 mL premix     1,000 mg200 mL/hr over 60 Minutes Intravenous  Once 10/06/2014 1802 11/01/2014 1930      Objective: Filed Weights   10/25/14 0500 10/26/14 0500  10/31/14 0436  Weight: 61.5 kg (135 lb 9.3 oz) 59.6 kg (131 lb 6.3 oz) 60 kg (132 lb 4.4 oz)    Intake/Output Summary (Last 24 hours) at 10-31-2014 0753 Last data filed at 2014/10/31 0600  Gross per 24 hour  Intake   3028 ml  Output    450 ml  Net   2578 ml     Vitals Filed Vitals:   31-Oct-2014 0400 10-31-2014 0403 October 31, 2014 0436 2014-10-31 0732  BP: 129/46 129/46  119/45   Pulse: 67 67  70  Temp:  97.4 F (36.3 C)  97.9 F (36.6 C)  TempSrc:  Axillary  Axillary  Resp: _0 Height:      Weight:   60 kg (132 lb 4.4 oz)   SpO2: 98% 100%  100%    Exam: General: Patient is arousable however, seems less responsive compared to yesterday's exam. He is now having trouble following commands Lungs: Bilateral rhonchi, no crackles or wheezing Cardiovascular: Regular rate and rhythm -without murmur gallop or rub normal S1 and S2 Abdomen: Nontender, nondistended, soft, bowel sounds positive, no rebound, no ascites, no appreciable mass Extremities: No significant cyanosis, clubbing, or edema bilateral lower extremities  Data Reviewed: Basic Metabolic Panel:  Recent Labs Lab 10/14/2014 1739 10/22/14 0248 10/23/14 0250 10/24/14 0242 10/26/14 0236  NA 133* 137 138 141 143  K 4.3 4.6 3.9 3.6* 4.0  CL 97 102 100 101 112  CO2 _1 GLUCOSE 201* 131* 114* 150* 142*  BUN 30* 32* 40* 56* 61*  CREATININE 1.47* 1.51* 1.34 1.64* 1.88*  CALCIUM 8.2* 8.1* 8.5 8.2* 8.1*  MG  --  1.8  --   --   --    Liver Function Tests:  Recent Labs Lab 10/29/2014 1739  AST 47*  ALT 22  ALKPHOS 84  BILITOT 0.8  PROT 6.7  ALBUMIN 2.9*   No results for input(s): LIPASE, AMYLASE in the last 168 hours. No results for input(s): AMMONIA in the last 168 hours. CBC:  Recent Labs Lab 10/06/2014 1739 10/22/14 0248 10/23/14 0250 10/24/14 0242 10/26/14 0236  WBC 11.7* 11.0* 7.7 8.0 8.7  NEUTROABS 10.8*  --   --   --   --   HGB 11.4* 10.5* 11.1* 10.9* 10.7*  HCT 35.4* 32.5* 33.1* 33.4* 32.5*  MCV 91.0 91.0 90.7 92.0 88.3  PLT 149* 203 142* 142* 176   Cardiac Enzymes:  Recent Labs Lab 10/22/14 1224 10/23/14 0928  TROPONINI 0.81* 1.05*   BNP (last 3 results)  Recent Labs  10/20/2014 1739  PROBNP 8315.0*   CBG: No results for input(s): GLUCAP in the last 168 hours.  Recent Results (from the past 240 hour(s))  Blood Culture (routine x 2)     Status:  None (Preliminary result)   Collection Time: 10/19/2014  6:10 PM  Result Value Ref Range Status   Specimen Description BLOOD ARM RIGHT  Final   Special Requests BOTTLES DRAWN AEROBIC AND ANAEROBIC 5CC  Final   Culture  Setup Time   Final    10/22/2014 01:21 Performed at [facility]    Culture   Final           BLOOD CULTURE RECEIVED NO GROWTH TO DATE CULTURE WILL BE HELD FOR 5 DAYS BEFORE ISSUING A FINAL NEGATIVE REPORT Performed at [facility]    Report Status PENDING  Incomplete  Blood Culture (routine x 2)     Status: None (Preliminary  result)   Collection Time: 11/01/2014  6:20 PM  Result Value Ref Range Status   Specimen Description BLOOD ARM LEFT  Final   Special Requests BOTTLES DRAWN AEROBIC AND ANAEROBIC 5CC  Final   Culture  Setup Time   Final    10/22/2014 01:21 Performed at [facility]    Culture   Final           BLOOD CULTURE RECEIVED NO GROWTH TO DATE CULTURE WILL BE HELD FOR 5 DAYS BEFORE ISSUING A FINAL NEGATIVE REPORT Performed at [facility]    Report Status PENDING  Incomplete  Urine culture     Status: None   Collection Time: 10/12/2014  6:26 PM  Result Value Ref Range Status   Specimen Description URINE, CATHETERIZED  Final   Special Requests NONE  Final   Culture  Setup Time   Final    10/22/2014 01:21 Performed at [place] Performed at [facility]   Final   Culture NO GROWTH Performed at [facility]   Final   Report Status 10/22/2014 FINAL  Final  MRSA PCR Screening     Status: Abnormal   Collection Time: 10/22/14 12:06 AM  Result Value Ref Range Status   MRSA by PCR POSITIVE (A) NEGATIVE Final    Comment:        The GeneXpert MRSA Assay (FDA approved for NASAL specimens only), is one component of a comprehensive MRSA colonization surveillance program. It is not intended to diagnose MRSA infection nor to guide or monitor treatment for MRSA  infections. RESULT CALLED TO, READ BACK BY AND VERIFIED WITH: T TOMLINSON,RN [id number] [place]   Respiratory virus panel (routine influenza)     Status: Abnormal   Collection Time: 10/22/14  2:45 AM  Result Value Ref Range Status   Source - RVPAN NASOPHARYNGEAL  Final   Respiratory Syncytial Virus A DETECTED (A)  Final   Respiratory Syncytial Virus B NOT DETECTED  Final   Influenza A NOT DETECTED  Final   Influenza B NOT DETECTED  Final   Parainfluenza 1 NOT DETECTED  Final   Parainfluenza 2 NOT DETECTED  Final   Parainfluenza 3 NOT DETECTED  Final   Metapneumovirus NOT DETECTED  Final   Rhinovirus NOT DETECTED  Final   Adenovirus NOT DETECTED  Final   Influenza A H1 NOT DETECTED  Final   Influenza A H3 NOT DETECTED  Final    Comment: (NOTE)       Normal Reference Range for each Analyte: NOT DETECTED Testing performed using the Luminex xTAG Respiratory Viral Panel test kit. The analytical performance characteristics of this assay have been determined by [facility].  The modifications have not been cleared or approved by the FDA. This assay has been validated pursuant to the CLIA regulations and is used for clinical purposes. Performed at [facility]      Studies:  Recent x-ray studies have been reviewed in detail by the Attending Physician  Scheduled Meds:  Scheduled Meds: . antiseptic oral rinse  15 mL Mouth Rinse 6 times per day  . azithromycin  500 mg Intravenous Q24H  . ceFEPime (MAXIPIME) IV  1 g Intravenous Q24H  . digoxin  0.0625 mg Intravenous Daily  . enoxaparin (LOVENOX) injection  60 mg Subcutaneous Q24H  . ipratropium-albuterol  3 mL Nebulization QID  . levothyroxine  37.5 mcg Intravenous Daily  . sodium chloride  3  mL Intravenous Q12H  . sodium chloride  3 mL Intravenous Q12H  . vancomycin  750 mg Intravenous Q48H   Continuous Infusions: . sodium chloride Stopped (10/26/14 2000)  . sodium chloride 10 mL/hr at 10/26/14 0300  .  sodium chloride 1,000 mL (10/26/14 1730)  . diltiazem (CARDIZEM) infusion 15 mg/hr (05-Nov-2014 0351)    Time spent on care of this patient: 35 min   Kelvin Cellar, MD 11/05/14, 7:53 AM  LOS: 6 days   [facility] Office  [phone] Pager - Text Page per [email or web address]  If 7PM-7AM, please contact night-coverage [email or web address]

## 2014-11-05 DEATH — deceased

## 2014-11-09 NOTE — Discharge Summary (Signed)
Death Summary  Miachel RouxRobert D Retzloff JYN:[id number] DOB: December 18, 1908 DOA: 11/03/2014  PCP: Kimber RelicGREEN, ARTHUR G, MD PCP/Office notified:   Admit date: 11/02/2014 Date of Death: 11/09/2014  Final Diagnoses:  Principal Problem:   Sepsis due to pneumonia Active Problems:   Hypothyroidism   CKD (chronic kidney disease), stage III   Chronic diastolic heart failure   Atrial flutter   AKI (acute kidney injury)   Elevated troponin I level   HCAP (healthcare-associated pneumonia)   Respiratory distress   Severe sepsis   Palliative care encounter   Dyspnea and respiratory abnormality   Shortness of breath     History of present illness:  Mr Riley LamDouglas is a 81 yo man with h/o chronic diastolic CHF, AF, HTN, CKD stage II and prior PNA who presents from SNF with respiratory distress. History is limited as patient has increased WOB, is tachypneic and unable to answer questions. His son was on his way out and was able to say that he saw him today at 5 pm from which time he had a significant decline in his respiratory status. Upon arrival to ED, he was febrile to 102.9 and noted to have a leukocytosis with CXR concerning for PNA. He was also mildly hypotensive and tachycardic meeting sepsis criteria. He was started on BiPAP for WOB, initiated on Abx, given 1L NS with improvement in BP and [facility] called for admission. Initial conversation regarding code status initiated in ED, however, decision not made at that time. Son Lucia Estelle(Bob Plazola) stated that his father said he would "want to live to [address]" and that if it was a potentially reversible cause would want aggressive measures done including mechanical ventilation.   Hospital Course:  Miachel RouxRobert D Sabic is a [age] y.o. male with a past medical history of diastolic heart failure, atrial flutter on pradaxa, hypertension, CKD3 and prior pneumonia who presents from skilled nursing facility due to respiratory distress and is found to have a right upper lobe infiltrate,  fever of 102.9, WBC count of 11.3, lactic acidosis, mildly elevated troponin at 0.19 and mild increase in creatinine from baseline. He was started on broad spectrum emperic antibiotic therapy with IV Vancomycin and Cefepime. Hospitalization was complicated by the development of atrial flutter for which cardiology was consulted and treated with IV cardizem and IV digoxin. He failed to show improvement and continued to have increased work of breathing despite the use of BiPAP. Goals of care discussed with his son and daughter. He was made a DNR. Given lack of meaningful improvement and becoming less responsive he was transitioned to full comfort care as antimicrobials were discontinued. Palliative care was was involved during this process. Patient expired on 20-May-2014 at 21:29.    SignedJeralyn Bennett:  Orley Lawry  [facility] 11/09/2014, 5:34 PM

## 2015-02-14 ENCOUNTER — Encounter: Payer: Self-pay | Admitting: Internal Medicine

## 2015-02-15 ENCOUNTER — Encounter: Payer: Self-pay | Admitting: Internal Medicine

## 2015-06-17 IMAGING — CR DG CHEST 1V PORT
1 series · 1 of 1 positions shown · non-contrast
Comparison: March 07, 2012.

CLINICAL DATA: Shortness of breath.

EXAM:
PORTABLE CHEST - 1 VIEW

[AP]
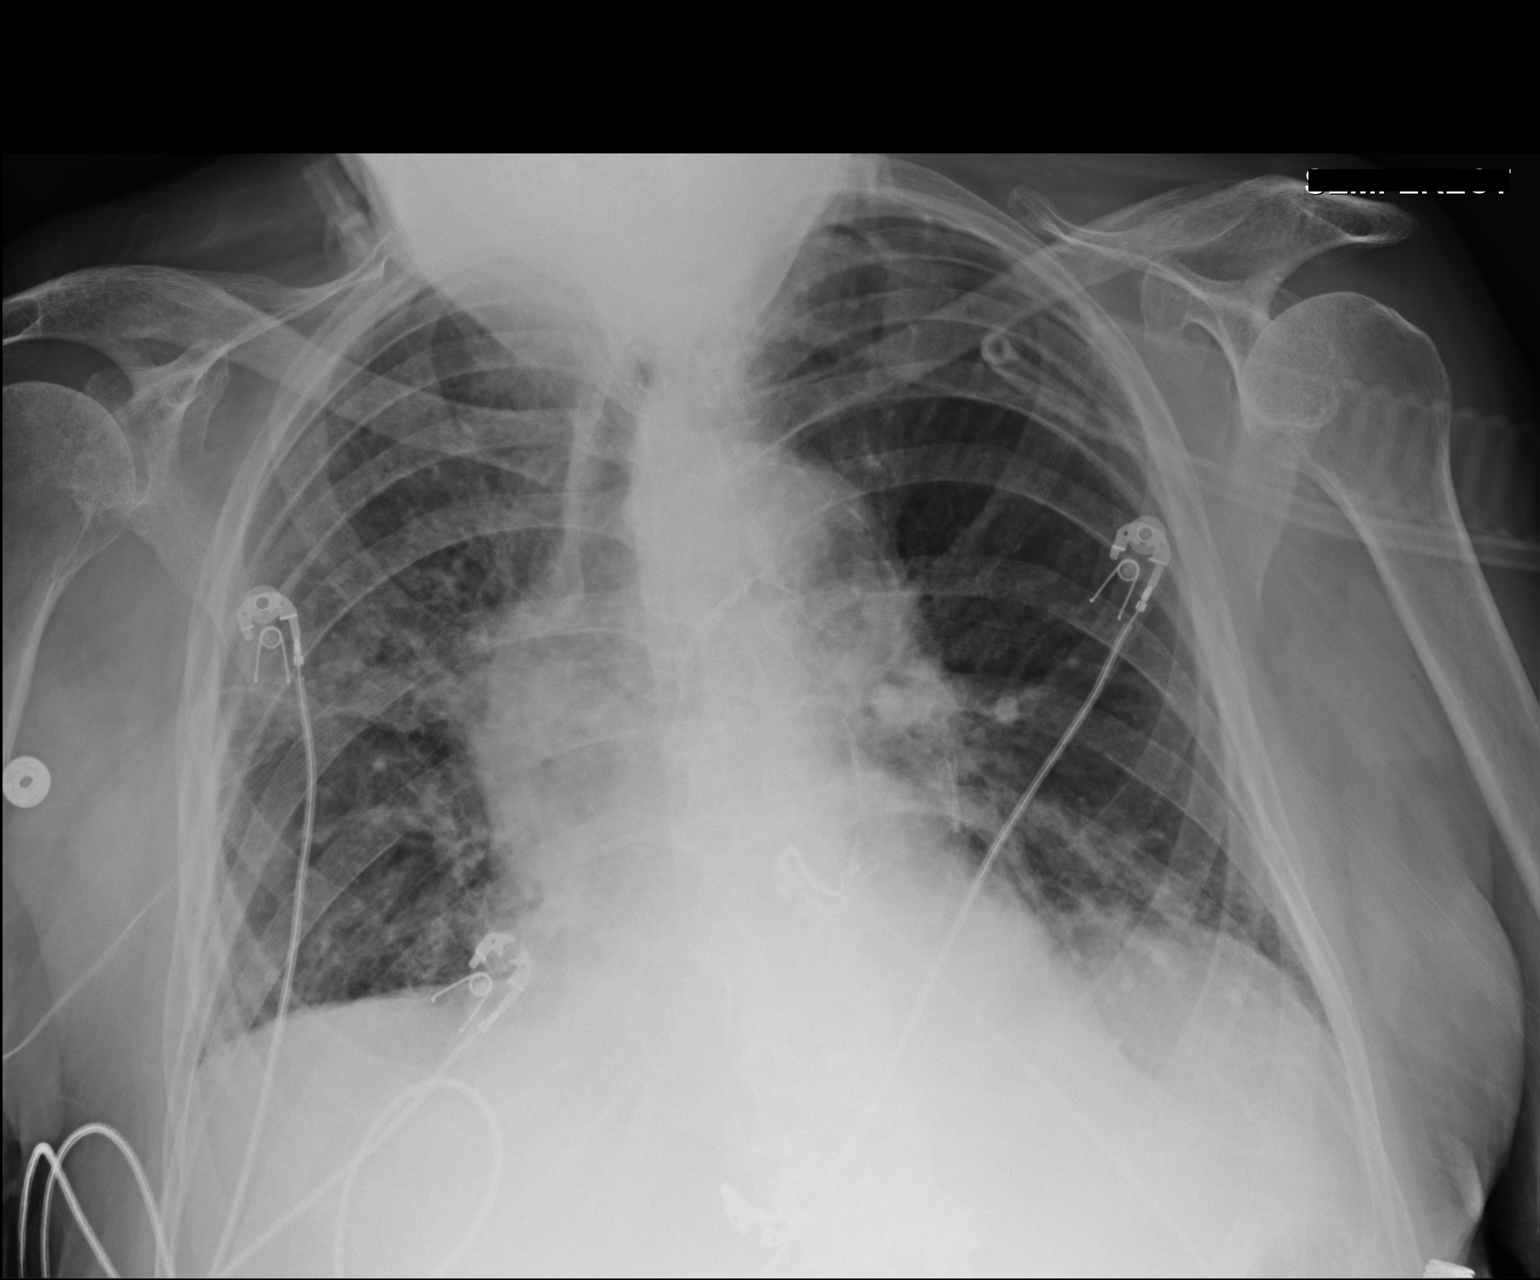

[1 of 1 positions shown; findings below may reference images not displayed]

FINDINGS: Stable cardiomediastinal silhouette. No pneumothorax or pleural
effusion is noted. Left lung is clear. Mild right upper lobe opacity
is noted concerning for focal atelectasis or possibly pneumonia.
Bony thorax appears intact.
IMPRESSION: Faint right upper lobe opacity is noted concerning for subsegmental
atelectasis or possibly pneumonia. Followup radiographs are
recommended to ensure resolution.
